# Patient Record
Sex: Female | Born: 1955 | Race: White | Hispanic: No | Marital: Married | Smoking: Never smoker
Health system: Southern US, Community
[De-identification: ages and names within clinical notes are randomized; demographics above are authoritative.]

## PROBLEM LIST (undated history)

## (undated) DIAGNOSIS — G4730 Sleep apnea, unspecified: Secondary | ICD-10-CM

## (undated) DIAGNOSIS — K219 Gastro-esophageal reflux disease without esophagitis: Secondary | ICD-10-CM

## (undated) DIAGNOSIS — E039 Hypothyroidism, unspecified: Secondary | ICD-10-CM

## (undated) DIAGNOSIS — E079 Disorder of thyroid, unspecified: Secondary | ICD-10-CM

## (undated) DIAGNOSIS — T7840XA Allergy, unspecified, initial encounter: Secondary | ICD-10-CM

## (undated) DIAGNOSIS — F32A Depression, unspecified: Secondary | ICD-10-CM

## (undated) DIAGNOSIS — R011 Cardiac murmur, unspecified: Secondary | ICD-10-CM

## (undated) DIAGNOSIS — F329 Major depressive disorder, single episode, unspecified: Secondary | ICD-10-CM

## (undated) HISTORY — DX: Depression, unspecified: F32.A

## (undated) HISTORY — DX: Disorder of thyroid, unspecified: E07.9

## (undated) HISTORY — PX: BREAST SURGERY: SHX581

## (undated) HISTORY — DX: Cardiac murmur, unspecified: R01.1

## (undated) HISTORY — DX: Allergy, unspecified, initial encounter: T78.40XA

## (undated) HISTORY — DX: Gastro-esophageal reflux disease without esophagitis: K21.9

## (undated) HISTORY — DX: Major depressive disorder, single episode, unspecified: F32.9

---

## 2001-10-29 ENCOUNTER — Encounter: Admission: RE | Admit: 2001-10-29 | Discharge: 2001-10-29 | Payer: Self-pay | Admitting: General Surgery

## 2001-10-29 ENCOUNTER — Encounter (HOSPITAL_BASED_OUTPATIENT_CLINIC_OR_DEPARTMENT_OTHER): Payer: Self-pay | Admitting: General Surgery

## 2001-10-31 ENCOUNTER — Encounter (HOSPITAL_BASED_OUTPATIENT_CLINIC_OR_DEPARTMENT_OTHER): Payer: Self-pay | Admitting: General Surgery

## 2001-10-31 ENCOUNTER — Encounter: Admission: RE | Admit: 2001-10-31 | Discharge: 2001-10-31 | Payer: Self-pay | Admitting: General Surgery

## 2001-10-31 ENCOUNTER — Ambulatory Visit (HOSPITAL_BASED_OUTPATIENT_CLINIC_OR_DEPARTMENT_OTHER): Admission: RE | Admit: 2001-10-31 | Discharge: 2001-10-31 | Payer: Self-pay | Admitting: General Surgery

## 2002-09-07 ENCOUNTER — Ambulatory Visit: Admission: RE | Admit: 2002-09-07 | Discharge: 2002-09-07 | Payer: Self-pay | Admitting: Pulmonary Disease

## 2002-11-24 ENCOUNTER — Ambulatory Visit (HOSPITAL_COMMUNITY): Admission: RE | Admit: 2002-11-24 | Discharge: 2002-11-24 | Payer: Self-pay | Admitting: Internal Medicine

## 2003-05-31 ENCOUNTER — Ambulatory Visit (HOSPITAL_COMMUNITY): Admission: RE | Admit: 2003-05-31 | Discharge: 2003-05-31 | Payer: Self-pay | Admitting: Internal Medicine

## 2012-10-30 ENCOUNTER — Other Ambulatory Visit (HOSPITAL_COMMUNITY): Payer: Self-pay | Admitting: *Deleted

## 2012-10-30 DIAGNOSIS — E079 Disorder of thyroid, unspecified: Secondary | ICD-10-CM

## 2012-11-06 ENCOUNTER — Ambulatory Visit (HOSPITAL_COMMUNITY)
Admission: RE | Admit: 2012-11-06 | Discharge: 2012-11-06 | Disposition: A | Discharge status: Home / Self Care | Payer: 59 | Source: Ambulatory Visit | Attending: *Deleted | Admitting: *Deleted

## 2012-11-06 ENCOUNTER — Other Ambulatory Visit (HOSPITAL_COMMUNITY): Payer: Self-pay | Admitting: *Deleted

## 2012-11-06 DIAGNOSIS — E079 Disorder of thyroid, unspecified: Secondary | ICD-10-CM

## 2012-11-06 DIAGNOSIS — E041 Nontoxic single thyroid nodule: Secondary | ICD-10-CM | POA: Insufficient documentation

## 2013-10-06 ENCOUNTER — Ambulatory Visit (INDEPENDENT_AMBULATORY_CARE_PROVIDER_SITE_OTHER): Payer: Medicare PPO | Admitting: Family Medicine

## 2013-10-06 ENCOUNTER — Encounter: Payer: Self-pay | Admitting: Family Medicine

## 2013-10-06 VITALS — BP 92/64 | HR 70 | Temp 97.6°F | Ht 63.5 in | Wt 171.2 lb

## 2013-10-06 DIAGNOSIS — K219 Gastro-esophageal reflux disease without esophagitis: Secondary | ICD-10-CM

## 2013-10-06 DIAGNOSIS — G8929 Other chronic pain: Secondary | ICD-10-CM

## 2013-10-06 DIAGNOSIS — M79609 Pain in unspecified limb: Secondary | ICD-10-CM

## 2013-10-06 DIAGNOSIS — E663 Overweight: Secondary | ICD-10-CM

## 2013-10-06 DIAGNOSIS — Z0000 Encounter for general adult medical examination without abnormal findings: Secondary | ICD-10-CM

## 2013-10-06 DIAGNOSIS — R609 Edema, unspecified: Secondary | ICD-10-CM

## 2013-10-06 DIAGNOSIS — K5900 Constipation, unspecified: Secondary | ICD-10-CM

## 2013-10-06 DIAGNOSIS — E042 Nontoxic multinodular goiter: Secondary | ICD-10-CM

## 2013-10-06 LAB — CBC WITH DIFFERENTIAL/PLATELET
Basophils Absolute: 0 10*3/uL (ref 0.0–0.1)
Basophils Relative: 1 % (ref 0–1)
Eosinophils Absolute: 0.1 10*3/uL (ref 0.0–0.7)
Eosinophils Relative: 2 % (ref 0–5)
HCT: 42.2 % (ref 36.0–46.0)
Hemoglobin: 14.3 g/dL (ref 12.0–15.0)
Lymphs Abs: 1.1 10*3/uL (ref 0.7–4.0)
MCH: 29.5 pg (ref 26.0–34.0)
MCHC: 33.9 g/dL (ref 30.0–36.0)
MCV: 87 fL (ref 78.0–100.0)
Monocytes Absolute: 0.3 10*3/uL (ref 0.1–1.0)
Monocytes Relative: 8 % (ref 3–12)
Neutro Abs: 2.2 10*3/uL (ref 1.7–7.7)
RBC: 4.85 MIL/uL (ref 3.87–5.11)
RDW: 14.2 % (ref 11.5–15.5)

## 2013-10-06 NOTE — Patient Instructions (Signed)
Sciatica with Rehab The sciatic nerve runs from the back down the leg and is responsible for sensation and control of the muscles in the back (posterior) side of the thigh, lower leg, and foot. Sciatica is a condition that is characterized by inflammation of this nerve.  SYMPTOMS   Signs of nerve damage, including numbness and/or weakness along the posterior side of the lower extremity.  Pain in the back of the thigh that may also travel down the leg.  Pain that worsens when sitting for long periods of time.  Occasionally, pain in the back or buttock. CAUSES  Inflammation of the sciatic nerve is the cause of sciatica. The inflammation is due to something irritating the nerve. Common sources of irritation include:  Sitting for long periods of time.  Direct trauma to the nerve.  Arthritis of the spine.  Herniated or ruptured disk.  Slipping of the vertebrae (spondylolithesis)  Pressure from soft tissues, such as muscles or ligament-like tissue (fascia). RISK INCREASES WITH:  Sports that place pressure or stress on the spine (football or weightlifting).  Poor strength and flexibility.  Failure to warm-up properly before activity.  Family history of low back pain or disk disorders.  Previous back injury or surgery.  Poor body mechanics, especially when lifting, or poor posture. PREVENTION   Warm up and stretch properly before activity.  Maintain physical fitness:  Strength, flexibility, and endurance.  Cardiovascular fitness.  Learn and use proper technique, especially with posture and lifting. When possible, have coach correct improper technique.  Avoid activities that place stress on the spine. PROGNOSIS If treated properly, then sciatica usually resolves within 6 weeks. However, occasionally surgery is necessary.  RELATED COMPLICATIONS   Permanent nerve damage, including pain, numbness, tingle, or weakness.  Chronic back pain.  Risks of surgery: infection,  bleeding, nerve damage, or damage to surrounding tissues. TREATMENT Treatment initially involves resting from any activities that aggravate your symptoms. The use of ice and medication may help reduce pain and inflammation. The use of strengthening and stretching exercises may help reduce pain with activity. These exercises may be performed at home or with referral to a therapist. A therapist may recommend further treatments, such as transcutaneous electronic nerve stimulation (TENS) or ultrasound. Your caregiver may recommend corticosteroid injections to help reduce inflammation of the sciatic nerve. If symptoms persist despite non-surgical (conservative) treatment, then surgery may be recommended. MEDICATION  If pain medication is necessary, then nonsteroidal anti-inflammatory medications, such as aspirin and ibuprofen, or other minor pain relievers, such as acetaminophen, are often recommended.  Do not take pain medication for 7 days before surgery.  Prescription pain relievers may be given if deemed necessary by your caregiver. Use only as directed and only as much as you need.  Ointments applied to the skin may be helpful.  Corticosteroid injections may be given by your caregiver. These injections should be reserved for the most serious cases, because they may only be given a certain number of times. HEAT AND COLD  Cold treatment (icing) relieves pain and reduces inflammation. Cold treatment should be applied for 10 to 15 minutes every 2 to 3 hours for inflammation and pain and immediately after any activity that aggravates your symptoms. Use ice packs or massage the area with a piece of ice (ice massage).  Heat treatment may be used prior to performing the stretching and strengthening activities prescribed by your caregiver, physical therapist, or athletic trainer. Use a heat pack or soak the injury in warm water. SEEK   MEDICAL CARE IF:  Treatment seems to offer no benefit, or the condition  worsens.  Any medications produce adverse side effects. EXERCISES  RANGE OF MOTION (ROM) AND STRETCHING EXERCISES - Sciatica Most people with sciatic will find that their symptoms worsen with either excessive bending forward (flexion) or arching at the low back (extension). The exercises which will help resolve your symptoms will focus on the opposite motion. Your physician, physical therapist or athletic trainer will help you determine which exercises will be most helpful to resolve your low back pain. Do not complete any exercises without first consulting with your clinician. Discontinue any exercises which worsen your symptoms until you speak to your clinician. If you have pain, numbness or tingling which travels down into your buttocks, leg or foot, the goal of the therapy is for these symptoms to move closer to your back and eventually resolve. Occasionally, these leg symptoms will get better, but your low back pain may worsen; this is typically an indication of progress in your rehabilitation. Be certain to be very alert to any changes in your symptoms and the activities in which you participated in the 24 hours prior to the change. Sharing this information with your clinician will allow him/her to most efficiently treat your condition. These exercises may help you when beginning to rehabilitate your injury. Your symptoms may resolve with or without further involvement from your physician, physical therapist or athletic trainer. While completing these exercises, remember:   Restoring tissue flexibility helps normal motion to return to the joints. This allows healthier, less painful movement and activity.  An effective stretch should be held for at least 30 seconds.  A stretch should never be painful. You should only feel a gentle lengthening or release in the stretched tissue. FLEXION RANGE OF MOTION AND STRETCHING EXERCISES: STRETCH  Flexion, Single Knee to Chest   Lie on a firm bed or floor  with both legs extended in front of you.  Keeping one leg in contact with the floor, bring your opposite knee to your chest. Hold your leg in place by either grabbing behind your thigh or at your knee.  Pull until you feel a gentle stretch in your low back. Hold __________ seconds.  Slowly release your grasp and repeat the exercise with the opposite side. Repeat __________ times. Complete this exercise __________ times per day.  STRETCH  Flexion, Double Knee to Chest  Lie on a firm bed or floor with both legs extended in front of you.  Keeping one leg in contact with the floor, bring your opposite knee to your chest.  Tense your stomach muscles to support your back and then lift your other knee to your chest. Hold your legs in place by either grabbing behind your thighs or at your knees.  Pull both knees toward your chest until you feel a gentle stretch in your low back. Hold __________ seconds.  Tense your stomach muscles and slowly return one leg at a time to the floor. Repeat __________ times. Complete this exercise __________ times per day.  STRETCH  Low Trunk Rotation   Lie on a firm bed or floor. Keeping your legs in front of you, bend your knees so they are both pointed toward the ceiling and your feet are flat on the floor.  Extend your arms out to the side. This will stabilize your upper body by keeping your shoulders in contact with the floor.  Gently and slowly drop both knees together to one side until you   feel a gentle stretch in your low back. Hold for __________ seconds.  Tense your stomach muscles to support your low back as you bring your knees back to the starting position. Repeat the exercise to the other side. Repeat __________ times. Complete this exercise __________ times per day  EXTENSION RANGE OF MOTION AND FLEXIBILITY EXERCISES: STRETCH  Extension, Prone on Elbows  Lie on your stomach on the floor, a bed will be too soft. Place your palms about shoulder  width apart and at the height of your head.  Place your elbows under your shoulders. If this is too painful, stack pillows under your chest.  Allow your body to relax so that your hips drop lower and make contact more completely with the floor.  Hold this position for __________ seconds.  Slowly return to lying flat on the floor. Repeat __________ times. Complete this exercise __________ times per day.  RANGE OF MOTION  Extension, Prone Press Ups  Lie on your stomach on the floor, a bed will be too soft. Place your palms about shoulder width apart and at the height of your head.  Keeping your back as relaxed as possible, slowly straighten your elbows while keeping your hips on the floor. You may adjust the placement of your hands to maximize your comfort. As you gain motion, your hands will come more underneath your shoulders.  Hold this position __________ seconds.  Slowly return to lying flat on the floor. Repeat __________ times. Complete this exercise __________ times per day.  STRENGTHENING EXERCISES - Sciatica  These exercises may help you when beginning to rehabilitate your injury. These exercises should be done near your "sweet spot." This is the neutral, low-back arch, somewhere between fully rounded and fully arched, that is your least painful position. When performed in this safe range of motion, these exercises can be used for people who have either a flexion or extension based injury. These exercises may resolve your symptoms with or without further involvement from your physician, physical therapist or athletic trainer. While completing these exercises, remember:   Muscles can gain both the endurance and the strength needed for everyday activities through controlled exercises.  Complete these exercises as instructed by your physician, physical therapist or athletic trainer. Progress with the resistance and repetition exercises only as your caregiver advises.  You may  experience muscle soreness or fatigue, but the pain or discomfort you are trying to eliminate should never worsen during these exercises. If this pain does worsen, stop and make certain you are following the directions exactly. If the pain is still present after adjustments, discontinue the exercise until you can discuss the trouble with your clinician. STRENGTHENING Deep Abdominals, Pelvic Tilt   Lie on a firm bed or floor. Keeping your legs in front of you, bend your knees so they are both pointed toward the ceiling and your feet are flat on the floor.  Tense your lower abdominal muscles to press your low back into the floor. This motion will rotate your pelvis so that your tail bone is scooping upwards rather than pointing at your feet or into the floor.  With a gentle tension and even breathing, hold this position for __________ seconds. Repeat __________ times. Complete this exercise __________ times per day.  STRENGTHENING  Abdominals, Crunches   Lie on a firm bed or floor. Keeping your legs in front of you, bend your knees so they are both pointed toward the ceiling and your feet are flat on the floor. Cross   your arms over your chest.  Slightly tip your chin down without bending your neck.  Tense your abdominals and slowly lift your trunk high enough to just clear your shoulder blades. Lifting higher can put excessive stress on the low back and does not further strengthen your abdominal muscles.  Control your return to the starting position. Repeat __________ times. Complete this exercise __________ times per day.  STRENGTHENING  Quadruped, Opposite UE/LE Lift  Assume a hands and knees position on a firm surface. Keep your hands under your shoulders and your knees under your hips. You may place padding under your knees for comfort.  Find your neutral spine and gently tense your abdominal muscles so that you can maintain this position. Your shoulders and hips should form a rectangle that  is parallel with the floor and is not twisted.  Keeping your trunk steady, lift your right hand no higher than your shoulder and then your left leg no higher than your hip. Make sure you are not holding your breath. Hold this position __________ seconds.  Continuing to keep your abdominal muscles tense and your back steady, slowly return to your starting position. Repeat with the opposite arm and leg. Repeat __________ times. Complete this exercise __________ times per day.  STRENGTHENING  Abdominals and Quadriceps, Straight Leg Raise   Lie on a firm bed or floor with both legs extended in front of you.  Keeping one leg in contact with the floor, bend the other knee so that your foot can rest flat on the floor.  Find your neutral spine, and tense your abdominal muscles to maintain your spinal position throughout the exercise.  Slowly lift your straight leg off the floor about 6 inches for a count of 15, making sure to not hold your breath.  Still keeping your neutral spine, slowly lower your leg all the way to the floor. Repeat this exercise with each leg __________ times. Complete this exercise __________ times per day. POSTURE AND BODY MECHANICS CONSIDERATIONS - Sciatica Keeping correct posture when sitting, standing or completing your activities will reduce the stress put on different body tissues, allowing injured tissues a chance to heal and limiting painful experiences. The following are general guidelines for improved posture. Your physician or physical therapist will provide you with any instructions specific to your needs. While reading these guidelines, remember:  The exercises prescribed by your provider will help you have the flexibility and strength to maintain correct postures.  The correct posture provides the optimal environment for your joints to work. All of your joints have less wear and tear when properly supported by a spine with good posture. This means you will  experience a healthier, less painful body.  Correct posture must be practiced with all of your activities, especially prolonged sitting and standing. Correct posture is as important when doing repetitive low-stress activities (typing) as it is when doing a single heavy-load activity (lifting). RESTING POSITIONS Consider which positions are most painful for you when choosing a resting position. If you have pain with flexion-based activities (sitting, bending, stooping, squatting), choose a position that allows you to rest in a less flexed posture. You would want to avoid curling into a fetal position on your side. If your pain worsens with extension-based activities (prolonged standing, working overhead), avoid resting in an extended position such as sleeping on your stomach. Most people will find more comfort when they rest with their spine in a more neutral position, neither too rounded nor too arched. Lying   on a non-sagging bed on your side with a pillow between your knees, or on your back with a pillow under your knees will often provide some relief. Keep in mind, being in any one position for a prolonged period of time, no matter how correct your posture, can still lead to stiffness. PROPER SITTING POSTURE In order to minimize stress and discomfort on your spine, you must sit with correct posture Sitting with good posture should be effortless for a healthy body. Returning to good posture is a gradual process. Many people can work toward this most comfortably by using various supports until they have the flexibility and strength to maintain this posture on their own. When sitting with proper posture, your ears will fall over your shoulders and your shoulders will fall over your hips. You should use the back of the chair to support your upper back. Your low back will be in a neutral position, just slightly arched. You may place a small pillow or folded towel at the base of your low back for support.  When  working at a desk, create an environment that supports good, upright posture. Without extra support, muscles fatigue and lead to excessive strain on joints and other tissues. Keep these recommendations in mind: CHAIR:   A chair should be able to slide under your desk when your back makes contact with the back of the chair. This allows you to work closely.  The chair's height should allow your eyes to be level with the upper part of your monitor and your hands to be slightly lower than your elbows. BODY POSITION  Your feet should make contact with the floor. If this is not possible, use a foot rest.  Keep your ears over your shoulders. This will reduce stress on your neck and low back. INCORRECT SITTING POSTURES   If you are feeling tired and unable to assume a healthy sitting posture, do not slouch or slump. This puts excessive strain on your back tissues, causing more damage and pain. Healthier options include:  Using more support, like a lumbar pillow.  Switching tasks to something that requires you to be upright or walking.  Talking a brief walk.  Lying down to rest in a neutral-spine position. PROLONGED STANDING WHILE SLIGHTLY LEANING FORWARD  When completing a task that requires you to lean forward while standing in one place for a long time, place either foot up on a stationary 2-4 inch high object to help maintain the best posture. When both feet are on the ground, the low back tends to lose its slight inward curve. If this curve flattens (or becomes too large), then the back and your other joints will experience too much stress, fatigue more quickly and can cause pain.  CORRECT STANDING POSTURES Proper standing posture should be assumed with all daily activities, even if they only take a few moments, like when brushing your teeth. As in sitting, your ears should fall over your shoulders and your shoulders should fall over your hips. You should keep a slight tension in your abdominal  muscles to brace your spine. Your tailbone should point down to the ground, not behind your body, resulting in an over-extended swayback posture.  INCORRECT STANDING POSTURES  Common incorrect standing postures include a forward head, locked knees and/or an excessive swayback. WALKING Walk with an upright posture. Your ears, shoulders and hips should all line-up. PROLONGED ACTIVITY IN A FLEXED POSITION When completing a task that requires you to bend forward at your   waist or lean over a low surface, try to find a way to stabilize 3 of 4 of your limbs. You can place a hand or elbow on your thigh or rest a knee on the surface you are reaching across. This will provide you more stability so that your muscles do not fatigue as quickly. By keeping your knees relaxed, or slightly bent, you will also reduce stress across your low back. CORRECT LIFTING TECHNIQUES DO :   Assume a wide stance. This will provide you more stability and the opportunity to get as close as possible to the object which you are lifting.  Tense your abdominals to brace your spine; then bend at the knees and hips. Keeping your back locked in a neutral-spine position, lift using your leg muscles. Lift with your legs, keeping your back straight.  Test the weight of unknown objects before attempting to lift them.  Try to keep your elbows locked down at your sides in order get the best strength from your shoulders when carrying an object.  Always ask for help when lifting heavy or awkward objects. INCORRECT LIFTING TECHNIQUES DO NOT:   Lock your knees when lifting, even if it is a small object.  Bend and twist. Pivot at your feet or move your feet when needing to change directions.  Assume that you cannot safely pick up a paperclip without proper posture. Document Released: 11/05/2005 Document Revised: 01/28/2012 Document Reviewed: 02/17/2009 [facility] Patient Information 2014 [place], [place]. Health Maintenance, Female A  healthy lifestyle and preventative care can promote health and wellness.  Maintain regular health, dental, and eye exams.  Eat a healthy diet. Foods like vegetables, fruits, whole grains, low-fat dairy products, and lean protein foods contain the nutrients you need without too many calories. Decrease your intake of foods high in solid fats, added sugars, and salt. Get information about a proper diet from your caregiver, if necessary.  Regular physical exercise is one of the most important things you can do for your health. Most adults should get at least 150 minutes of moderate-intensity exercise (any activity that increases your heart rate and causes you to sweat) each week. In addition, most adults need muscle-strengthening exercises on 2 or more days a week.   Maintain a healthy weight. The body mass index (BMI) is a screening tool to identify possible weight problems. It provides an estimate of body fat based on height and weight. Your caregiver can help determine your BMI, and can help you achieve or maintain a healthy weight. For adults 20 years and older:  A BMI below 18.5 is considered underweight.  A BMI of 18.5 to 24.9 is normal.  A BMI of 25 to 29.9 is considered overweight.  A BMI of 30 and above is considered obese.  Maintain normal blood lipids and cholesterol by exercising and minimizing your intake of saturated fat. Eat a balanced diet with plenty of fruits and vegetables. Blood tests for lipids and cholesterol should begin at age 26 and be repeated every 5 years. If your lipid or cholesterol levels are high, you are over 50, or you are a high risk for heart disease, you may need your cholesterol levels checked more frequently.Ongoing high lipid and cholesterol levels should be treated with medicines if diet and exercise are not effective.  If you smoke, find out from your caregiver how to quit. If you do not use tobacco, do not start.  Lung cancer screening is recommended for  adults aged 16 80 years who  are at high risk for developing lung cancer because of a history of smoking. Yearly low-dose computed tomography (CT) is recommended for people who have at least a 30-pack-year history of smoking and are a current smoker or have quit within the past 15 years. A pack year of smoking is smoking an average of 1 pack of cigarettes a day for 1 year (for example: 1 pack a day for 30 years or 2 packs a day for 15 years). Yearly screening should continue until the smoker has stopped smoking for at least 15 years. Yearly screening should also be stopped for people who develop a health problem that would prevent them from having lung cancer treatment.  If you are pregnant, do not drink alcohol. If you are breastfeeding, be very cautious about drinking alcohol. If you are not pregnant and choose to drink alcohol, do not exceed 1 drink per day. One drink is considered to be 12 ounces (355 mL) of beer, 5 ounces (148 mL) of wine, or 1.5 ounces (44 mL) of liquor.  Avoid use of street drugs. Do not share needles with anyone. Ask for help if you need support or instructions about stopping the use of drugs.  High blood pressure causes heart disease and increases the risk of stroke. Blood pressure should be checked at least every 1 to 2 years. Ongoing high blood pressure should be treated with medicines, if weight loss and exercise are not effective.  If you are 84 to 59 years old, ask your caregiver if you should take aspirin to prevent strokes.  Diabetes screening involves taking a blood sample to check your fasting blood sugar level. This should be done once every 3 years, after age 54, if you are within normal weight and without risk factors for diabetes. Testing should be considered at a younger age or be carried out more frequently if you are overweight and have at least 1 risk factor for diabetes.  Breast cancer screening is essential preventative care for women. You should practice  "breast self-awareness." This means understanding the normal appearance and feel of your breasts and may include breast self-examination. Any changes detected, no matter how small, should be reported to a caregiver. Women in their 15s and 30s should have a clinical breast exam (CBE) by a caregiver as part of a regular health exam every 1 to 3 years. After age 60, women should have a CBE every year. Starting at age 49, women should consider having a mammogram (breast X-ray) every year. Women who have a family history of breast cancer should talk to their caregiver about genetic screening. Women at a high risk of breast cancer should talk to their caregiver about having an MRI and a mammogram every year.  Breast cancer gene (BRCA)-related cancer risk assessment is recommended for women who have family members with BRCA-related cancers. BRCA-related cancers include breast, ovarian, tubal, and peritoneal cancers. Having family members with these cancers may be associated with an increased risk for harmful changes (mutations) in the breast cancer genes BRCA1 and BRCA2. Results of the assessment will determine the need for genetic counseling and BRCA1 and BRCA2 testing.  The Pap test is a screening test for cervical cancer. Women should have a Pap test starting at age 85. Between ages 45 and 93, Pap tests should be repeated every 2 years. Beginning at age 24, you should have a Pap test every 3 years as long as the past 3 Pap tests have been normal. If you had a  hysterectomy for a problem that was not cancer or a condition that could lead to cancer, then you no longer need Pap tests. If you are between ages 8 and 23, and you have had normal Pap tests going back 10 years, you no longer need Pap tests. If you have had past treatment for cervical cancer or a condition that could lead to cancer, you need Pap tests and screening for cancer for at least 20 years after your treatment. If Pap tests have been discontinued,  risk factors (such as a new sexual partner) need to be reassessed to determine if screening should be resumed. Some women have medical problems that increase the chance of getting cervical cancer. In these cases, your caregiver may recommend more frequent screening and Pap tests.  The human papillomavirus (HPV) test is an additional test that may be used for cervical cancer screening. The HPV test looks for the virus that can cause the cell changes on the cervix. The cells collected during the Pap test can be tested for HPV. The HPV test could be used to screen women aged 33 years and older, and should be used in women of any age who have unclear Pap test results. After the age of 28, women should have HPV testing at the same frequency as a Pap test.  Colorectal cancer can be detected and often prevented. Most routine colorectal cancer screening begins at the age of 29 and continues through age 18. However, your caregiver may recommend screening at an earlier age if you have risk factors for colon cancer. On a yearly basis, your caregiver may provide home test kits to check for hidden blood in the stool. Use of a small camera at the end of a tube, to directly examine the colon (sigmoidoscopy or colonoscopy), can detect the earliest forms of colorectal cancer. Talk to your caregiver about this at age 50, when routine screening begins. Direct examination of the colon should be repeated every 5 to 10 years through age 75, unless early forms of pre-cancerous polyps or small growths are found.  Hepatitis C blood testing is recommended for all people born from [birth year] through 1967 and any individual with known risks for hepatitis C.  Practice safe sex. Use condoms and avoid high-risk sexual practices to reduce the spread of sexually transmitted infections (STIs). Sexually active women aged 26 and younger should be checked for Chlamydia, which is a common sexually transmitted infection. Older women with new or  multiple partners should also be tested for Chlamydia. Testing for other STIs is recommended if you are sexually active and at increased risk.  Osteoporosis is a disease in which the bones lose minerals and strength with aging. This can result in serious bone fractures. The risk of osteoporosis can be identified using a bone density scan. Women ages 51 and over and women at risk for fractures or osteoporosis should discuss screening with their caregivers. Ask your caregiver whether you should be taking a calcium supplement or vitamin D to reduce the rate of osteoporosis.  Menopause can be associated with physical symptoms and risks. Hormone replacement therapy is available to decrease symptoms and risks. You should talk to your caregiver about whether hormone replacement therapy is right for you.  Use sunscreen. Apply sunscreen liberally and repeatedly throughout the day. You should seek shade when your shadow is shorter than you. Protect yourself by wearing long sleeves, pants, a wide-brimmed hat, and sunglasses year round, whenever you are outdoors.  Notify your caregiver of  new moles or changes in moles, especially if there is a change in shape or color. Also notify your caregiver if a mole is larger than the size of a pencil eraser.  Stay current with your immunizations. Document Released: 05/21/2011 Document Revised: 03/02/2013 Document Reviewed: 05/21/2011 [facility], [facility], [facility] Patient Information 2014 [place], [place].

## 2013-10-06 NOTE — Progress Notes (Signed)
Subjective:    Patient ID: Elaine Brady, female    DOB: [birth year], 57 y.o.   MRN: [id number]  HPI Thyroid nodules - Pt has h/o thyroid nodules most recently evluated 12/13. Repeat u/s was suggested for 6 mos but she ha snot had done yet.   Hypothyroid - h/o hypothyroid. sythroid increased 8 weeks ago.   Back pain - b/l low back, worse with standing and riding long distances in the car. Radiates down back of legs. Intermittant. No cord compression signs.   Foot pain - worse in heel on left foot, saw chiropractor (while she was there for back) and was told 2% plantar fasciitis and given exercises to do which have not really helped. It is worse with first steps of day.   Swelling of b/l LE - on bumex for h/o b/l LE swelling. Her diet is very low salt and  she eats protein 1-2 times daily. Has not had echo. No h/o shob. She does have a h/o palpitations and cardiac murmer in the past.   Constipation - resolved on mg and probiotics, has not had mag checked. Occasional miralax  gerd - on ppi, had egd years ago and was told she just had irritation. Occasional feeling that food gets stuck in throat but not bothering her. 20mg  ppi has not controlled sx.   Insomnia - stable on 100mg  trazodone qhs. Tried melatonin and it didn't help.   Also uses essential oils - copaiba frankinsense, balsom fir, in vegetable capsule  will try tumeric  # Health maint - pap - 1 year ago, normal, no abnormals, due in 3 years (unsire about hpv on pap) - mammo/pert FH - scheduled, had bx in past - physician breast exam - had at gyn this year - c-scope/pert FH - 3-4 year ago as f/u, had large polyp removed prior, due in 2 years - dexa - no risk factors, had dexa few years ago - will obtain results - BP screen - 92/64 at goal - cholesterol screen - 1 year ago checked, due for recheck - fasting glucose screen -  1 year ago, wnl but strong FH DM - chlamydia screen (if female under age 77) - does not request to be  checked - tobacco - none - alcohol - none - drugs - none - Hep C (born 78-1967) - doesn't think she has been checked, so due - tdap (once over age 63 in place of DT) - 2012 - DT (every 10 years until age 29, once age 77+) - due in 8 years - flu - plans to get at work - pneumovax - not old enough yet - zostavax (if over 33) - not old enough yet - Vit D - has not had checked  Review of Systems per hpi     Objective:   Physical Exam  Nursing note and vitals reviewed. Constitutional: She is oriented to person, place, and time. She appears well-developed and well-nourished.  HENT:  Right Ear: External ear normal.  Left Ear: External ear normal.  Nose: Nose normal.  Mouth/Throat: Oropharynx is clear and moist. No oropharyngeal exudate.  Eyes: Conjunctivae are normal. Pupils are equal, round, and reactive to light.  Neck: Normal range of motion. Neck supple. No thyromegaly present although slight fullness felt on left.  Cardiovascular: Normal rate, regular rhythm and normal heart sounds.   Pulmonary/Chest: Effort normal and breath sounds normal.  Abdominal: Soft. Bowel sounds are normal. She exhibits no distension. There is no tenderness. There is no rebound.  Lymphadenopathy:    She has no cervical adenopathy.  Neurological: She is alert and oriented to person, place, and time. She has normal reflexes.  Skin: Skin is warm and dry. numerous cherry hemangiomas. Pt declined full skin survey.she does have atoenail fungus Psychiatric: She has a normal mood and affect. Her behavior is normal.        Assessment & Plan:  Deyanira was seen today for establish care. We did her annual cpe as well as address >3 chronic medical conditions.   Diagnoses and associated orders for this visit:  Multiple thyroid nodules - US Soft Tissue Head/Neck; Future - get within next 2 weeks  Heel pain, chronic, left - DG Foot Complete Left; Future - r/o heel spurs  Edema - 2D Echocardiogram without  contrast - Comprehensive metabolic panel   Health care maintenance - CBC with Differential - Comprehensive metabolic panel - Hemoglobin A1c - T4, free - TSH - Vitamin B12 - Vit D  25 hydroxy (rtn osteoporosis monitoring) - Lipid panel - Magnesium - Hepatitis C antibody  GERD (gastroesophageal reflux disease) - CBC with Differential - if worse will repeat egd  Overweight - Hemoglobin A1c - TSH - Lipid panel  Unspecified constipation - Magnesium  Insomnia - cont ttrazodone and wean off if able  Toenail fungyus - vicks for 3-4 mos  I'll see her back in 6 mos to f/u on chronic med problems, 1 year for cpe

## 2013-10-07 ENCOUNTER — Telehealth: Payer: Self-pay | Admitting: *Deleted

## 2013-10-07 LAB — COMPREHENSIVE METABOLIC PANEL
AST: 60 U/L — ABNORMAL HIGH (ref 0–37)
Albumin: 4.2 g/dL (ref 3.5–5.2)
BUN: 19 mg/dL (ref 6–23)
Calcium: 9.3 mg/dL (ref 8.4–10.5)
Chloride: 101 mEq/L (ref 96–112)
Glucose, Bld: 93 mg/dL (ref 70–99)
Potassium: 4.3 mEq/L (ref 3.5–5.3)
Sodium: 137 mEq/L (ref 135–145)
Total Bilirubin: 0.5 mg/dL (ref 0.3–1.2)
Total Protein: 6.9 g/dL (ref 6.0–8.3)

## 2013-10-07 LAB — LIPID PANEL
Cholesterol: 246 mg/dL — ABNORMAL HIGH (ref 0–200)
HDL: 61 mg/dL (ref >39)
Total CHOL/HDL Ratio: 4 Ratio
Triglycerides: 157 mg/dL — ABNORMAL HIGH (ref <150)
VLDL: 31 mg/dL (ref 0–40)

## 2013-10-07 LAB — HEMOGLOBIN A1C: Mean Plasma Glucose: 114 mg/dL (ref <117)

## 2013-10-07 LAB — HEPATITIS C ANTIBODY: HCV Ab: NEGATIVE

## 2013-10-07 LAB — VITAMIN B12: Vitamin B-12: 679 pg/mL (ref 211–911)

## 2013-10-07 LAB — T4, FREE: Free T4: 1.23 ng/dL (ref 0.80–1.80)

## 2013-10-07 NOTE — Telephone Encounter (Signed)
Pt called nurse back and she was made aware of lab results and message left from MD. Pt appreciative and understanding.

## 2013-10-07 NOTE — Telephone Encounter (Signed)
Nurse called number on file for pt, no answer, message left for callback.  

## 2013-10-07 NOTE — Telephone Encounter (Signed)
Message copied by [facility], Bonnell Public on Wed Oct 07, 2013  2:18 PM ------      Message from: Acey Lav      Created: Wed Oct 07, 2013  8:18 AM       Please let pt know I got her labs back.       Cbc, a1c, tsh, T4, B12, magnesium, hep C - look good      cmp - fine except her ast and alt (liver markers) are mildly elevated. Has she ever been told these numbers were up in the past?      Vit D - low. Im sending her in high dose vit D for 8 weeks to take once weekly. After that she should take an OTC calcium plus D tablet of her choosing. When i see her in f/u we can recheck her D level.       Cholesterol - high. Id like her to maximize her lifestyle modifications for the next few months and we can recheck cholesterol in 6 mos or so. She should eat oatmeal daily if possible, not eat much red meat, and be sure to exercise at least 20 minutes 5x/week.      I'll let her know when i get results of her upcoming studies and will plan to see her in 3 mos as scheduled. Thanks AW ------

## 2013-10-07 NOTE — Telephone Encounter (Signed)
Message copied by [facility], Bonnell Public on Wed Oct 07, 2013  2:24 PM ------      Message from: Acey Lav      Created: Wed Oct 07, 2013  8:18 AM       Please let pt know I got her labs back.       Cbc, a1c, tsh, T4, B12, magnesium, hep C - look good      cmp - fine except her ast and alt (liver markers) are mildly elevated. Has she ever been told these numbers were up in the past?      Vit D - low. Im sending her in high dose vit D for 8 weeks to take once weekly. After that she should take an OTC calcium plus D tablet of her choosing. When i see her in f/u we can recheck her D level.       Cholesterol - high. Id like her to maximize her lifestyle modifications for the next few months and we can recheck cholesterol in 6 mos or so. She should eat oatmeal daily if possible, not eat much red meat, and be sure to exercise at least 20 minutes 5x/week.      I'll let her know when i get results of her upcoming studies and will plan to see her in 3 mos as scheduled. Thanks AW ------

## 2013-10-21 ENCOUNTER — Telehealth: Payer: Self-pay | Admitting: *Deleted

## 2013-10-21 NOTE — Telephone Encounter (Signed)
Pt called and left VM stating that she was supposed to have a scan done and had questions regarding a Rx that she did not receive. Nurse returned call, no answer, message left for callback.

## 2013-10-22 ENCOUNTER — Telehealth: Payer: Self-pay | Admitting: *Deleted

## 2013-10-22 DIAGNOSIS — E039 Hypothyroidism, unspecified: Secondary | ICD-10-CM

## 2013-10-22 DIAGNOSIS — E559 Vitamin D deficiency, unspecified: Secondary | ICD-10-CM

## 2013-10-22 NOTE — Telephone Encounter (Signed)
Pt called and left VM again stating that MD was supposed to call in an order for Vitamin D and she needed a refill on her Synthroid that she has not received and she was following up. Will route to MD.

## 2013-10-23 MED ORDER — CHOLECALCIFEROL 1.25 MG (50000 UT) PO TABS: ORAL_TABLET | ORAL | Status: DC

## 2013-10-23 MED ORDER — LEVOTHYROXINE SODIUM 75 MCG PO TABS: 75.0000 ug | ORAL_TABLET | Freq: Every day | ORAL | Status: DC

## 2013-10-23 NOTE — Telephone Encounter (Signed)
Pt notified and appreciative.

## 2013-10-23 NOTE — Telephone Encounter (Signed)
Ive sent these in. Thanks AW.

## 2013-10-23 NOTE — Addendum Note (Signed)
Addended by: Acey Lav on: 10/23/2013 08:18 AM   Modules accepted: Orders

## 2013-10-27 ENCOUNTER — Telehealth: Payer: Self-pay | Admitting: Family Medicine

## 2013-10-27 NOTE — Telephone Encounter (Signed)
No refill indicated. AW

## 2013-10-27 NOTE — Telephone Encounter (Signed)
Refill request: Vitamin D# 500000 UNT CAP     QTY:8     Take one capsule by mouth once a week for 8 weeks  [employer] [phone]

## 2013-11-06 ENCOUNTER — Telehealth: Payer: Self-pay | Admitting: *Deleted

## 2013-11-06 ENCOUNTER — Telehealth: Payer: Self-pay | Admitting: Family Medicine

## 2013-11-06 NOTE — Telephone Encounter (Signed)
I received a mammogram report today on this patient. Please let the patient know that her mammogram was within normal limits. She should be receiving a copy of the report to her house. She should have a repeat screening mammogram in one year. Thank you

## 2013-11-06 NOTE — Telephone Encounter (Signed)
See telephone encounter.

## 2013-11-06 NOTE — Telephone Encounter (Signed)
Pt notified and appreciative.

## 2013-11-25 ENCOUNTER — Telehealth: Payer: Self-pay | Admitting: *Deleted

## 2013-11-25 DIAGNOSIS — F329 Major depressive disorder, single episode, unspecified: Secondary | ICD-10-CM

## 2013-11-25 DIAGNOSIS — F32A Depression, unspecified: Secondary | ICD-10-CM

## 2013-11-25 NOTE — Telephone Encounter (Signed)
Pt called and left VM stating that she was running low on medication and needed a refill before her appointment in February. She requests refill on Prozac. Will route to MD for approval

## 2013-11-26 MED ORDER — FLUOXETINE HCL 20 MG PO CAPS: 20.0000 mg | ORAL_CAPSULE | Freq: Every day | ORAL | Status: DC

## 2013-11-26 NOTE — Telephone Encounter (Signed)
She is only currently needing refill on prozac. The medication is effective and is working for her. She stated she has been on medication for years and that she did make staff aware of medication when she came in for initial visit.

## 2013-11-26 NOTE — Addendum Note (Signed)
Addended by: Doran Heater on: 11/26/2013 12:19 PM   Modules accepted: Orders

## 2013-11-26 NOTE — Telephone Encounter (Signed)
What meds, aside from prozac does she request refills on? I did not see her for depression or anything related to prozac so Id need to know if she has been on a stable dose, what the dose is, and how long she has been on it. Thanks aw

## 2013-11-30 ENCOUNTER — Ambulatory Visit (HOSPITAL_COMMUNITY)
Admission: RE | Admit: 2013-11-30 | Discharge: 2013-11-30 | Disposition: A | Discharge status: Home / Self Care | Payer: 59 | Source: Ambulatory Visit | Attending: Family Medicine | Admitting: Family Medicine

## 2013-11-30 DIAGNOSIS — Z6830 Body mass index (BMI) 30.0-30.9, adult: Secondary | ICD-10-CM | POA: Insufficient documentation

## 2013-11-30 DIAGNOSIS — I369 Nonrheumatic tricuspid valve disorder, unspecified: Secondary | ICD-10-CM

## 2013-11-30 DIAGNOSIS — R609 Edema, unspecified: Secondary | ICD-10-CM | POA: Insufficient documentation

## 2013-11-30 DIAGNOSIS — K219 Gastro-esophageal reflux disease without esophagitis: Secondary | ICD-10-CM | POA: Insufficient documentation

## 2013-11-30 DIAGNOSIS — R002 Palpitations: Secondary | ICD-10-CM | POA: Insufficient documentation

## 2013-11-30 NOTE — Progress Notes (Signed)
*  PRELIMINARY RESULTS* Echocardiogram 2D Echocardiogram has been performed.  [place], [place] 11/30/2013, 12:29 PM

## 2013-12-02 ENCOUNTER — Telehealth: Payer: Self-pay | Admitting: *Deleted

## 2013-12-02 NOTE — Telephone Encounter (Signed)
Pt called and notified of results. Appreciative and understanding.

## 2013-12-02 NOTE — Telephone Encounter (Signed)
Message copied by [facility], Louis Meckel on Wed Dec 02, 2013  3:06 PM ------      Message from: Doran Heater      Created: Wed Dec 02, 2013 10:02 AM       Please let pt know her echo looked great. Very mild leakiness on tricuspid valve which is not a big deal and doesn't mean anything as far as management goes. This would not give her any sx or cause problems for her. Thanks AW ------

## 2013-12-02 NOTE — Telephone Encounter (Signed)
Nurse called to give pt results. No answer, message left for callback

## 2013-12-02 NOTE — Progress Notes (Signed)
Pt notified and appreciative.

## 2013-12-02 NOTE — Telephone Encounter (Signed)
Message copied by [facility], Louis Meckel on Wed Dec 02, 2013  2:07 PM ------      Message from: Doran Heater      Created: Wed Dec 02, 2013 10:02 AM       Please let pt know her echo looked great. Very mild leakiness on tricuspid valve which is not a big deal and doesn't mean anything as far as management goes. This would not give her any sx or cause problems for her. Thanks AW ------

## 2013-12-07 ENCOUNTER — Ambulatory Visit (HOSPITAL_COMMUNITY)
Admission: RE | Admit: 2013-12-07 | Discharge: 2013-12-07 | Disposition: A | Discharge status: Home / Self Care | Payer: 59 | Source: Ambulatory Visit | Attending: Family Medicine | Admitting: Family Medicine

## 2013-12-07 DIAGNOSIS — M79672 Pain in left foot: Principal | ICD-10-CM

## 2013-12-07 DIAGNOSIS — M898X9 Other specified disorders of bone, unspecified site: Secondary | ICD-10-CM | POA: Insufficient documentation

## 2013-12-07 DIAGNOSIS — M79609 Pain in unspecified limb: Secondary | ICD-10-CM | POA: Insufficient documentation

## 2013-12-07 DIAGNOSIS — G8929 Other chronic pain: Secondary | ICD-10-CM

## 2013-12-07 DIAGNOSIS — E042 Nontoxic multinodular goiter: Secondary | ICD-10-CM | POA: Insufficient documentation

## 2013-12-17 ENCOUNTER — Telehealth: Payer: Self-pay | Admitting: *Deleted

## 2013-12-17 NOTE — Telephone Encounter (Signed)
Pt called requesting results from recent tests done. Will route to MD.

## 2013-12-18 ENCOUNTER — Telehealth: Payer: Self-pay | Admitting: *Deleted

## 2013-12-18 NOTE — Telephone Encounter (Signed)
Message copied by [facility], Louis Meckel on Fri Dec 18, 2013  4:21 PM ------      Message from: Doran Heater      Created: Fri Dec 18, 2013 11:41 AM       Please let pt know she has a small heel spur on the bottom of her foot. If she is still having pain we can refer to podiatry. Thanks aw ------

## 2013-12-18 NOTE — Telephone Encounter (Signed)
Pt notified and appreciative.

## 2013-12-18 NOTE — Telephone Encounter (Signed)
Message copied by [facility], Louis Meckel on Fri Dec 18, 2013  4:23 PM ------      Message from: Doran Heater      Created: Fri Dec 18, 2013 11:40 AM       Please let pt know u/s shows thyroid stable. Thanks aw ------

## 2013-12-21 ENCOUNTER — Telehealth: Payer: Self-pay | Admitting: Family Medicine

## 2013-12-21 NOTE — Telephone Encounter (Signed)
Podiatry ref'l

## 2014-01-04 ENCOUNTER — Encounter: Payer: Self-pay | Admitting: Family Medicine

## 2014-01-04 ENCOUNTER — Ambulatory Visit (INDEPENDENT_AMBULATORY_CARE_PROVIDER_SITE_OTHER): Payer: BC Managed Care – PPO | Admitting: Family Medicine

## 2014-01-04 VITALS — BP 126/78 | HR 61 | Temp 98.6°F | Resp 18 | Ht 62.0 in | Wt 169.2 lb

## 2014-01-04 DIAGNOSIS — E039 Hypothyroidism, unspecified: Secondary | ICD-10-CM | POA: Insufficient documentation

## 2014-01-04 DIAGNOSIS — E785 Hyperlipidemia, unspecified: Secondary | ICD-10-CM

## 2014-01-04 DIAGNOSIS — R609 Edema, unspecified: Secondary | ICD-10-CM

## 2014-01-04 DIAGNOSIS — K219 Gastro-esophageal reflux disease without esophagitis: Secondary | ICD-10-CM

## 2014-01-04 DIAGNOSIS — G4700 Insomnia, unspecified: Secondary | ICD-10-CM | POA: Insufficient documentation

## 2014-01-04 DIAGNOSIS — J309 Allergic rhinitis, unspecified: Secondary | ICD-10-CM

## 2014-01-04 LAB — LIPID PANEL
CHOL/HDL RATIO: 3.7 ratio
Cholesterol: 236 mg/dL — ABNORMAL HIGH (ref 0–200)
HDL: 64 mg/dL (ref >39)
LDL Cholesterol: 151 mg/dL — ABNORMAL HIGH (ref 0–99)
Triglycerides: 105 mg/dL (ref <150)
VLDL: 21 mg/dL (ref 0–40)

## 2014-01-04 LAB — BASIC METABOLIC PANEL
BUN: 18 mg/dL (ref 6–23)
CALCIUM: 9.3 mg/dL (ref 8.4–10.5)
CHLORIDE: 106 meq/L (ref 96–112)
CO2: 25 mEq/L (ref 19–32)
Creat: 0.72 mg/dL (ref 0.50–1.10)
GLUCOSE: 88 mg/dL (ref 70–99)
Potassium: 5 mEq/L (ref 3.5–5.3)
Sodium: 137 mEq/L (ref 135–145)

## 2014-01-04 MED ORDER — OMEPRAZOLE 40 MG PO CPDR
40.0000 mg | DELAYED_RELEASE_CAPSULE | Freq: Every day | ORAL | Status: AC
Start: 2014-01-04 — End: ?

## 2014-01-04 MED ORDER — BUMETANIDE 0.5 MG PO TABS: 0.2500 mg | ORAL_TABLET | Freq: Every day | ORAL | Status: DC | PRN

## 2014-01-04 MED ORDER — TRAZODONE HCL 100 MG PO TABS: 50.0000 mg | ORAL_TABLET | Freq: Every evening | ORAL | Status: DC | PRN

## 2014-01-04 MED ORDER — LEVOCETIRIZINE DIHYDROCHLORIDE 5 MG PO TABS: 5.0000 mg | ORAL_TABLET | Freq: Every evening | ORAL | Status: DC

## 2014-01-04 NOTE — Patient Instructions (Signed)
Bumetanide tablets What is this medicine? BUMETANIDE (byoo MET a nide) is a diuretic. It helps you make more urine and to lose salt and excess water from your body. This medicine is used to treat high blood pressure, and edema or swelling from heart, kidney, or liver disease. This medicine may be used for other purposes; ask your health care provider or pharmacist if you have questions. COMMON BRAND NAME(S): Bumex What should I tell my health care provider before I take this medicine? They need to know if you have any of these conditions: -abnormal blood electrolytes -diarrhea or vomiting -gout -heart disease -kidney disease, small amounts of urine, or difficulty passing urine -liver disease -an unusual or allergic reaction to bumetanide, sulfa drugs, other medicines, foods, dyes, or preservatives -pregnant or trying to get pregnant -breast-feeding How should I use this medicine? Take this medicine by mouth with a glass of water. Follow the directions on the prescription label. You may take this medicine with or without food but if it upsets your stomach, take it with food or milk. Do not take it more often than directed. Remember that you will need to pass more urine after taking this medicine. Do not take it at a time of day that will cause you problems. Do not take at bedtime. Talk to your pediatrician regarding the use of this medicine in children. Special care may be needed. Overdosage: If you think you have taken too much of this medicine contact a poison control center or emergency room at once. NOTE: This medicine is only for you. Do not share this medicine with others. What if I miss a dose? If you miss a dose, take it as soon as you can. If it is almost time for your next dose, take only that dose. Do not take double or extra doses. What may interact with this medicine? -alcohol -certain antibiotics given by injection -diuretics -heart medicines like digoxin and  dofetilide -hormones like cortisone, fludrocortisone, hydrocortisone -lithium -medicines for diabetes -medicines for high blood pressure -medicines for inflammation like indomethacin -OTC supplements like ginseng and ephedra This list may not describe all possible interactions. Give your health care provider a list of all the medicines, herbs, non-prescription drugs, or dietary supplements you use. Also tell them if you smoke, drink alcohol, or use illegal drugs. Some items may interact with your medicine. What should I watch for while using this medicine? Visit your doctor or health care professional for regular check ups. Check your blood pressure regularly. Ask your doctor or health care professional what your blood pressure should be, and when you should contact him or her. If you are a diabetic, check your blood sugar as directed. You may need to be on a special diet while taking this medicine. Ask your doctor. Also, ask how many glasses of fluid you need to drink each day. You must not get dehydrated. You may get drowsy or dizzy. Do not drive, use machinery, or do anything that needs mental alertness until you know how this drug affects you. Do not stand or sit up quickly, especially if you are an older patient. This reduces the risk of dizzy or fainting spells. Alcohol can make you more drowsy and dizzy. Avoid alcoholic drinks. What side effects may I notice from receiving this medicine? Side effects that you should report to your doctor or health care professional as soon as possible: -allergic reactions like skin rash, itching or hives, swelling of the face, lips, or tongue -blood in  the urine or stools -blurred vision -dry mouth -fever or chills -hearing loss or ringing in the ears -irregular heartbeat -muscle cramps, pain or weakness -unusually weak or tired -vomiting or diarrhea Side effects that usually do not require medical attention (report to your doctor or health care  professional if they continue or are bothersome): -headache -loss of appetite -unusual bleeding or bruising This list may not describe all possible side effects. Call your doctor for medical advice about side effects. You may report side effects to FDA at 1-800-FDA-1088. Where should I keep my medicine? Keep out of the reach of children. Store at room temperature between 15 and 30 degrees C (59 and 86 degrees F). Throw away any unused medicine after the expiration date. NOTE: This sheet is a summary. It may not cover all possible information. If you have questions about this medicine, talk to your doctor, pharmacist, or health care provider.  2014, [employer]. (2008-02-04 15:55:56)

## 2014-01-04 NOTE — Progress Notes (Signed)
Subjective:     Patient ID: Elaine Brady, female   DOB: [birth year], 58 y.o.   MRN: [id number]  HPI Comments: Elaine Brady is a 58 y.o WF here for 3 month follow up.  She is here for medication refills of her chronic medical conditions to include peripheral edema, GERD, Insomnia, Allergic rhinitis, and hypothyroidism. She has been on the same medicines for years. She is also taking Bumex for fluid and she says she takes this everyday, despite having normal Echo a few months ago. She says she has never had any heart trouble or SOB. She does report some occasional dizziness that comes and goes for months now but she has attributed this to her allergies. She says she hasn't noticed the dizziness in the last few days and says she hasn't really paid attention to it when asked about this symptom in the last week. She does report being out of her Bumex for the last week because she ran out and knew she had an appt soon and she would get this medicine filled today. She denies any SOB, chest pains, and says she feels fine other wise.  She also was noted to have HLD and was working on lifestyle changes in order to get this cholesterol lowered.  She is UTD with her mammogram and colonoscopy  Past Medical History  Diagnosis Date  . GERD (gastroesophageal reflux disease)   . Depression   . Heart murmur   . Thyroid disease   . Allergy    Current Outpatient Prescriptions on File Prior to Visit  Medication Sig Dispense Refill  . Cholecalciferol 50000 UNITS TABS 1 tab weekly for 8 weeks  8 tablet  0  . FLUoxetine (PROZAC) 20 MG capsule Take 1 capsule (20 mg total) by mouth daily.  30 capsule  1  . levothyroxine (SYNTHROID, LEVOTHROID) 75 MCG tablet Take 1 tablet (75 mcg total) by mouth daily before breakfast.  90 tablet  1  . Magnesium 400 MG CAPS Take by mouth.      . Probiotic Product (PROBIOTIC DAILY PO) Take by mouth.       No current facility-administered medications on file prior to visit.   Allergies   Allergen Reactions  . Bactrim [Sulfamethoxazole-Tmp Ds] Rash   History   Social History  . Marital Status: Married    Spouse Name: N/A    Number of Children: N/A  . Years of Education: N/A   Occupational History  . Not on file.   Social History Main Topics  . Smoking status: Never Smoker   . Smokeless tobacco: Never Used  . Alcohol Use: No  . Drug Use: No  . Sexual Activity: Yes    Birth Control/ Protection: Post-menopausal   Other Topics Concern  . Not on file   Social History Narrative  . No narrative on file     Review of Systems  Constitutional: Negative for chills, activity change and appetite change.  HENT: Positive for sinus pressure. Negative for congestion, postnasal drip, rhinorrhea and sore throat.        Chronic and relieved by her xyzal   Eyes: Negative for discharge, redness and visual disturbance.  Cardiovascular: Positive for leg swelling. Negative for chest pain and palpitations.  Gastrointestinal: Negative for nausea, vomiting, abdominal pain, diarrhea and constipation.  Endocrine: Negative for cold intolerance, heat intolerance, polydipsia and polyuria.  Genitourinary: Positive for frequency. Negative for dysuria, urgency, hematuria and difficulty urinating.       In the morning time after taking  the Bumex   Musculoskeletal: Negative for back pain, gait problem and joint swelling.  Skin: Negative for color change and rash.  Allergic/Immunologic: Negative for environmental allergies and immunocompromised state.  Neurological: Positive for dizziness and light-headedness. Negative for syncope, speech difficulty, weakness, numbness and headaches.  Hematological: Negative for adenopathy. Does not bruise/bleed easily.  Psychiatric/Behavioral: Positive for sleep disturbance. Negative for suicidal ideas, confusion, decreased concentration and agitation.       Objective:   Physical Exam  Nursing note and vitals reviewed. Constitutional: She is  oriented to person, place, and time. She appears well-developed and well-nourished.  Wears corrective lenses   HENT:  Head: Normocephalic and atraumatic.  Right Ear: External ear normal.  Left Ear: External ear normal.  Nose: Nose normal.  Mouth/Throat: Oropharynx is clear and moist.  Eyes: Conjunctivae are normal. Pupils are equal, round, and reactive to light.  Neck: Normal range of motion. Neck supple. No JVD present. No tracheal deviation present. No thyromegaly present.  Cardiovascular: Normal rate, regular rhythm, normal heart sounds and intact distal pulses.   Pulmonary/Chest: Effort normal and breath sounds normal. No respiratory distress. She has no wheezes. She exhibits no tenderness.  Abdominal: Soft. Bowel sounds are normal. She exhibits no distension. There is no tenderness. There is no rebound.  Musculoskeletal: Normal range of motion.  Lymphadenopathy:    She has no cervical adenopathy.  Neurological: She is alert and oriented to person, place, and time.  Skin: Skin is warm and dry.  Psychiatric: She has a normal mood and affect. Her behavior is normal. Judgment and thought content normal.       Assessment:     [name] was seen today for follow-up.  Diagnoses and associated orders for this visit:  Edema - bumetanide (BUMEX) 0.5 MG tablet; Take 0.5 tablets (0.25 mg total) by mouth daily as needed. - Basic metabolic panel; Future - Basic metabolic panel  Insomnia - traZODone (DESYREL) 100 MG tablet; Take 0.5 tablets (50 mg total) by mouth at bedtime as needed for sleep.  GERD (gastroesophageal reflux disease) - omeprazole (PRILOSEC) 40 MG capsule; Take 1 capsule (40 mg total) by mouth daily.  Allergic rhinitis - levocetirizine (XYZAL) 5 MG tablet; Take 1 tablet (5 mg total) by mouth every evening.  HLD (hyperlipidemia) - Lipid Panel; Future - Lipid Panel  Unspecified hypothyroidism - TSH; Future - T4, free; Future - TSH - T4, free       Plan:      Refilled medicines and will go ahead and get blood work for monitoring of chronic medicines. To follow up via phone on labs and will see back in 6 months if lab results are good.

## 2014-01-05 LAB — T4, FREE: Free T4: 1.31 ng/dL (ref 0.80–1.80)

## 2014-01-05 LAB — TSH: TSH: 0.639 u[IU]/mL (ref 0.350–4.500)

## 2014-01-06 ENCOUNTER — Other Ambulatory Visit: Payer: Self-pay | Admitting: Family Medicine

## 2014-01-06 DIAGNOSIS — E039 Hypothyroidism, unspecified: Secondary | ICD-10-CM

## 2014-01-06 MED ORDER — LEVOTHYROXINE SODIUM 50 MCG PO TABS: 50.0000 ug | ORAL_TABLET | Freq: Every day | ORAL | Status: AC

## 2014-01-06 NOTE — Progress Notes (Signed)
Spoke to patient regarding the lab results. TSH down a great deal since 3 months ago. Have decreased her synthroid dose from 17mcg to 34mcg daily. Orders placed to get these rechecked in 8 weeks.

## 2014-01-20 ENCOUNTER — Other Ambulatory Visit: Payer: Self-pay | Admitting: Family Medicine

## 2014-11-29 ENCOUNTER — Encounter (INDEPENDENT_AMBULATORY_CARE_PROVIDER_SITE_OTHER): Payer: Self-pay | Admitting: *Deleted

## 2015-06-13 ENCOUNTER — Encounter (INDEPENDENT_AMBULATORY_CARE_PROVIDER_SITE_OTHER): Payer: Self-pay | Admitting: *Deleted

## 2015-06-24 ENCOUNTER — Other Ambulatory Visit (INDEPENDENT_AMBULATORY_CARE_PROVIDER_SITE_OTHER): Payer: Self-pay | Admitting: *Deleted

## 2015-06-24 ENCOUNTER — Encounter (INDEPENDENT_AMBULATORY_CARE_PROVIDER_SITE_OTHER): Payer: Self-pay | Admitting: *Deleted

## 2015-06-24 DIAGNOSIS — Z86010 Personal history of colonic polyps: Secondary | ICD-10-CM

## 2015-08-31 ENCOUNTER — Telehealth (INDEPENDENT_AMBULATORY_CARE_PROVIDER_SITE_OTHER): Payer: Self-pay | Admitting: *Deleted

## 2015-08-31 DIAGNOSIS — Z1211 Encounter for screening for malignant neoplasm of colon: Secondary | ICD-10-CM

## 2015-08-31 NOTE — Telephone Encounter (Signed)
Patient needs trilyte 

## 2015-09-01 MED ORDER — PEG 3350-KCL-NA BICARB-NACL 420 G PO SOLR: 4000.0000 mL | Freq: Once | ORAL | Status: DC

## 2015-09-13 ENCOUNTER — Telehealth (INDEPENDENT_AMBULATORY_CARE_PROVIDER_SITE_OTHER): Payer: Self-pay | Admitting: *Deleted

## 2015-09-13 NOTE — Telephone Encounter (Signed)
agree

## 2015-09-13 NOTE — Telephone Encounter (Signed)
Referring MD/PCP: fusco   Procedure: tcs  Reason/Indication:  Hx polyps  Has patient had this procedure before?  Yes, 6 yrs ago ([facility]) was normal -- Dr Laural Golden done one in early 2000 and a large polyp was removed, she repeats in 6 months and it was normal  If so, when, by whom and where?    Is there a family history of colon cancer?  no  Who?  What age when diagnosed?    Is patient diabetic?   no      Does patient have prosthetic heart valve or mechanical valve?  no  Do you have a pacemaker?  no  Has patient ever had endocarditis? no  Has patient had joint replacement within last 12 months?  no  Does patient tend to be constipated or take laxatives? sometime  Does patient have a history of alcohol/drug use?  no  Is patient on Coumadin, Plavix and/or Aspirin? no  Medications: fluoxetine 20 mg daily, bumetanide 0.5 mg 1/2 tab daily, levothyroxine 75 mcg daily, magnesium 400 mg daily, calcium daily, probiotic daily, multi vit daily, prilosec bid  Allergies: bactrim  Medication Adjustment:   Procedure date & time: 10/12/15 at 930

## 2015-10-12 ENCOUNTER — Encounter (HOSPITAL_COMMUNITY): Payer: Self-pay | Admitting: *Deleted

## 2015-10-12 ENCOUNTER — Encounter (HOSPITAL_COMMUNITY)
Admission: RE | Disposition: A | Discharge status: Home / Self Care | Payer: Self-pay | Source: Ambulatory Visit | Attending: Internal Medicine

## 2015-10-12 ENCOUNTER — Ambulatory Visit (HOSPITAL_COMMUNITY)
Admission: RE | Admit: 2015-10-12 | Discharge: 2015-10-12 | Disposition: A | Discharge status: Home / Self Care | Payer: BC Managed Care – PPO | Source: Ambulatory Visit | Attending: Internal Medicine | Admitting: Internal Medicine

## 2015-10-12 DIAGNOSIS — E079 Disorder of thyroid, unspecified: Secondary | ICD-10-CM | POA: Insufficient documentation

## 2015-10-12 DIAGNOSIS — K635 Polyp of colon: Secondary | ICD-10-CM | POA: Diagnosis not present

## 2015-10-12 DIAGNOSIS — Z79899 Other long term (current) drug therapy: Secondary | ICD-10-CM | POA: Diagnosis not present

## 2015-10-12 DIAGNOSIS — K648 Other hemorrhoids: Secondary | ICD-10-CM | POA: Diagnosis not present

## 2015-10-12 DIAGNOSIS — F329 Major depressive disorder, single episode, unspecified: Secondary | ICD-10-CM | POA: Diagnosis not present

## 2015-10-12 DIAGNOSIS — Z86010 Personal history of colonic polyps: Secondary | ICD-10-CM | POA: Diagnosis not present

## 2015-10-12 DIAGNOSIS — D125 Benign neoplasm of sigmoid colon: Secondary | ICD-10-CM | POA: Diagnosis not present

## 2015-10-12 DIAGNOSIS — K219 Gastro-esophageal reflux disease without esophagitis: Secondary | ICD-10-CM | POA: Diagnosis not present

## 2015-10-12 DIAGNOSIS — K644 Residual hemorrhoidal skin tags: Secondary | ICD-10-CM | POA: Diagnosis not present

## 2015-10-12 DIAGNOSIS — Z1211 Encounter for screening for malignant neoplasm of colon: Secondary | ICD-10-CM | POA: Insufficient documentation

## 2015-10-12 HISTORY — PX: COLONOSCOPY: SHX5424

## 2015-10-12 LAB — HEPATIC FUNCTION PANEL
ALK PHOS: 91 U/L (ref 38–126)
ALT: 43 U/L (ref 14–54)
AST: 30 U/L (ref 15–41)
Albumin: 4 g/dL (ref 3.5–5.0)
Bilirubin, Direct: 0.1 mg/dL (ref 0.1–0.5)
Indirect Bilirubin: 0.4 mg/dL (ref 0.3–0.9)
TOTAL PROTEIN: 6.9 g/dL (ref 6.5–8.1)
Total Bilirubin: 0.5 mg/dL (ref 0.3–1.2)

## 2015-10-12 SURGERY — COLONOSCOPY
Anesthesia: Moderate Sedation

## 2015-10-12 MED ORDER — MEPERIDINE HCL 50 MG/ML IJ SOLN
INTRAMUSCULAR | Status: AC
  Filled 2015-10-12: qty 1

## 2015-10-12 MED ORDER — MIDAZOLAM HCL 5 MG/5ML IJ SOLN
INTRAMUSCULAR | Status: AC
  Filled 2015-10-12: qty 10

## 2015-10-12 MED ORDER — SODIUM CHLORIDE 0.9 % IV SOLN
INTRAVENOUS | Status: DC
  Administered 2015-10-12: 1000 mL via INTRAVENOUS

## 2015-10-12 MED ORDER — MIDAZOLAM HCL 5 MG/5ML IJ SOLN
INTRAMUSCULAR | Status: DC | PRN
  Administered 2015-10-12: 2 mg via INTRAVENOUS
  Administered 2015-10-12: 1 mg via INTRAVENOUS
  Administered 2015-10-12 (×2): 2 mg via INTRAVENOUS

## 2015-10-12 MED ORDER — MEPERIDINE HCL 50 MG/ML IJ SOLN
INTRAMUSCULAR | Status: DC | PRN
  Administered 2015-10-12 (×2): 25 mg via INTRAVENOUS

## 2015-10-12 MED ORDER — STERILE WATER FOR IRRIGATION IR SOLN
Status: DC | PRN
  Administered 2015-10-12: 10:00:00

## 2015-10-12 NOTE — H&P (Signed)
Elaine Brady is an 59 y.o. female.   Chief Complaint:  Patient is here for colonoscopy. HPI:  Patient is 59 year old Caucasian female who has history of colonic adenomas and is here for surveillance colonoscopy. She had large tubular adenoma removal first colonoscopy. No polyps were found in second or third colonoscopy. Last exam was  In [facility]. She denies abdominal pain change in bowel habits or rectal bleeding.  Family history is negative for CRC.  Past Medical History  Diagnosis Date  . GERD (gastroesophageal reflux disease)   . Depression   . Heart murmur   . Thyroid disease   . Allergy     Past Surgical History  Procedure Laterality Date  . Breast surgery      Family History  Problem Relation Age of Onset  . Diabetes Mother   . Hyperlipidemia Mother   . Hypertension Mother   . Heart disease Father   . Diabetes Father   . Hyperlipidemia Sister   . Heart disease Sister   . Hyperlipidemia Brother   . Hypertension Brother   . Diabetes Brother   . Hyperlipidemia Brother   . Hyperlipidemia Brother    Social History:  reports that she has never smoked. She has never used smokeless tobacco. She reports that she does not drink alcohol or use illicit drugs.  Allergies:  Allergies  Allergen Reactions  . Bactrim [Sulfamethoxazole-Trimethoprim] Rash    Medications Prior to Admission  Medication Sig Dispense Refill  . calcium-vitamin D (OSCAL WITH D) 500-200 MG-UNIT per tablet Take 1 tablet by mouth.    Marland Kitchen FLUoxetine (PROZAC) 20 MG capsule TAKE ONE CAPSULE BY MOUTH ONCE DAILY 30 capsule 0  . levothyroxine (SYNTHROID, LEVOTHROID) 50 MCG tablet Take 1 tablet (50 mcg total) by mouth daily before breakfast. 60 tablet 0  . Magnesium 400 MG CAPS Take 1 capsule by mouth daily.     . Multiple Vitamins-Minerals (CENTRUM SILVER ADULT 50+ PO) Take 1 tablet by mouth daily.     Marland Kitchen omeprazole (PRILOSEC) 40 MG capsule Take 1 capsule (40 mg total) by mouth daily. 90  capsule 0  . polyethylene glycol-electrolytes (NULYTELY/GOLYTELY) 420 G solution Take 4,000 mLs by mouth once. 4000 mL 0  . Probiotic Product (PROBIOTIC DAILY PO) Take 1 capsule by mouth daily.     . bumetanide (BUMEX) 0.5 MG tablet Take 0.5 tablets (0.25 mg total) by mouth daily as needed. (Patient taking differently: Take 0.25 mg by mouth daily as needed (fluid). ) 90 tablet 0    No results found for this or any previous visit (from the past 48 hour(s)). No results found.  ROS  Blood pressure 127/75, pulse 64, temperature 98.9 F (37.2 C), temperature source Oral, resp. rate 11, height 5' 3.5" (1.613 m), weight 177 lb (80.287 kg), SpO2 99 %. Physical Exam  Constitutional: She appears well-developed and well-nourished.  HENT:  Mouth/Throat: Oropharynx is clear and moist.  Eyes: Conjunctivae are normal. No scleral icterus.  Neck: No thyromegaly present.  Cardiovascular: Normal rate, regular rhythm and normal heart sounds.   No murmur heard. Respiratory: Effort normal and breath sounds normal.  GI: Soft. She exhibits no distension and no mass. There is no tenderness.  Musculoskeletal: She exhibits no edema.  Lymphadenopathy:    She has no cervical adenopathy.  Neurological: She is alert.  Skin: Skin is warm and dry.     Assessment/Plan  History of colonic adenomas.  Surveillance colonoscopy  Elaine Brady U 10/12/2015, 9:32 AM

## 2015-10-12 NOTE — Discharge Instructions (Signed)
Resume usual medications and diet.  No driving for 24 hours.  Physician will call with results of blood tests and biopsy.  Next colonoscopy in 5 to 6 years.  Colonoscopy, Care After Refer to this sheet in the next few weeks. These instructions provide you with information on caring for yourself after your procedure. Your health care provider may also give you more specific instructions. Your treatment has been planned according to current medical practices, but problems sometimes occur. Call your health care provider if you have any problems or questions after your procedure. WHAT TO EXPECT AFTER THE PROCEDURE  After your procedure, it is typical to have the following:  A small amount of blood in your stool.  Moderate amounts of gas and mild abdominal cramping or bloating. HOME CARE INSTRUCTIONS  Do not drive, operate machinery, or sign important documents for 24 hours.  You may shower and resume your regular physical activities, but move at a slower pace for the first 24 hours.  Take frequent rest periods for the first 24 hours.  Walk around or put a warm pack on your abdomen to help reduce abdominal cramping and bloating.  Drink enough fluids to keep your urine clear or pale yellow.  You may resume your normal diet as instructed by your health care provider. Avoid heavy or fried foods that are hard to digest.  Avoid drinking alcohol for 24 hours or as instructed by your health care provider.  Only take over-the-counter or prescription medicines as directed by your health care provider.  If a tissue sample (biopsy) was taken during your procedure:  Do not take aspirin or blood thinners for 7 days, or as instructed by your health care provider.  Do not drink alcohol for 7 days, or as instructed by your health care provider.  Eat soft foods for the first 24 hours. SEEK MEDICAL CARE IF: You have persistent spotting of blood in your stool 2-3 days after the procedure. SEEK  IMMEDIATE MEDICAL CARE IF:  You have more than a small spotting of blood in your stool.  You pass large blood clots in your stool.  Your abdomen is swollen (distended).  You have nausea or vomiting.  You have a fever.  You have increasing abdominal pain that is not relieved with medicine.   This information is not intended to replace advice given to you by your health care provider. Make sure you discuss any questions you have with your health care provider.   Document Released: 06/19/2004 Document Revised: 08/26/2013 Document Reviewed: 07/13/2013 Elsevier Interactive Patient Education 2016 Elsevier Inc[employer]  Colon Polyps Polyps are lumps of extra tissue growing inside the body. Polyps can grow in the large intestine (colon). Most colon polyps are noncancerous (benign). However, some colon polyps can become cancerous over time. Polyps that are larger than a pea may be harmful. To be safe, caregivers remove and test all polyps. CAUSES  Polyps form when mutations in the genes cause your cells to grow and divide even though no more tissue is needed. RISK FACTORS There are a number of risk factors that can increase your chances of getting colon polyps. They include:  Being older than 50 years.  Family history of colon polyps or colon cancer.  Long-term colon diseases, such as colitis or Crohn disease.  Being overweight.  Smoking.  Being inactive.  Drinking too much alcohol. SYMPTOMS  Most small polyps do not cause symptoms. If symptoms are present, they may include:  Blood in the stool. The  stool may look dark red or black.  Constipation or diarrhea that lasts longer than 1 week. DIAGNOSIS People often do not know they have polyps until their caregiver finds them during a regular checkup. Your caregiver can use 4 tests to check for polyps:  Digital rectal exam. The caregiver wears gloves and feels inside the rectum. This test would find polyps only in the rectum.  Barium  enema. The caregiver puts a liquid called barium into your rectum before taking X-rays of your colon. Barium makes your colon look white. Polyps are dark, so they are easy to see in the X-ray pictures.  Sigmoidoscopy. A thin, flexible tube (sigmoidoscope) is placed into your rectum. The sigmoidoscope has a light and tiny camera in it. The caregiver uses the sigmoidoscope to look at the last third of your colon.  Colonoscopy. This test is like sigmoidoscopy, but the caregiver looks at the entire colon. This is the most common method for finding and removing polyps. TREATMENT  Any polyps will be removed during a sigmoidoscopy or colonoscopy. The polyps are then tested for cancer. PREVENTION  To help lower your risk of getting more colon polyps:  Eat plenty of fruits and vegetables. Avoid eating fatty foods.  Do not smoke.  Avoid drinking alcohol.  Exercise every day.  Lose weight if recommended by your caregiver.  Eat plenty of calcium and folate. Foods that are rich in calcium include milk, cheese, and broccoli. Foods that are rich in folate include chickpeas, kidney beans, and spinach. HOME CARE INSTRUCTIONS Keep all follow-up appointments as directed by your caregiver. You may need periodic exams to check for polyps. SEEK MEDICAL CARE IF: You notice bleeding during a bowel movement.   This information is not intended to replace advice given to you by your health care provider. Make sure you discuss any questions you have with your health care provider.   Document Released: 08/01/2004 Document Revised: 11/26/2014 Document Reviewed: 01/15/2012 Elsevier Interactive Patient Education [employer].

## 2015-10-12 NOTE — Op Note (Signed)
COLONOSCOPY PROCEDURE REPORT  PATIENT:  Elaine Brady  MR#:  [id number] Birthdate:  06-Oct-1956, 59 y.o., female Endoscopist:  Dr. Rogene Houston, MD Referred By:  Dr.  Glo Herring, MD Procedure Date: 10/12/2015  Procedure:   Colonoscopy  Indications:   Patient is 59 year old Caucasian female was history of colonic adenomas. Last exam was 6 years ago in [facility] no polyps are found.  Informed Consent:  The procedure and risks were reviewed with the patient and informed consent was obtained.  Medications:  Demerol 50 mg IV Versed 7 mg IV  Description of procedure:  After a digital rectal exam was performed, that colonoscope was advanced from the anus through the rectum and colon to the area of the cecum, ileocecal valve and appendiceal orifice. The cecum was deeply intubated. These structures were well-seen and photographed for the record. From the level of the cecum and ileocecal valve, the scope was slowly and cautiously withdrawn. The mucosal surfaces were carefully surveyed utilizing scope tip to flexion to facilitate fold flattening as needed. The scope was pulled down into the rectum where a thorough exam including retroflexion was performed.  Findings:   Prep excellent. Small polyp ablated via cold biopsy from proximal sigmoid colon. Normal rectal mucosa. Small hemorrhoids below the dentate line.  Therapeutic/Diagnostic Maneuvers Performed:   None  Complications:   none  EBL: None  Cecal Withdrawal Time:  13 minutes  Impression:   Examination performed to cecum.  Small polyp ablated while cold biopsy from sigmoid colon.  Small external hemorrhoids.   comment:  Patient noted to have elevated AST and ALT in July 2014.  Recommendations:  Standard instructions given.  Will check LFTs today. I will contact patient with biopsy results and further recommendations.   Vineeth Fell U  10/12/2015 10:10 AM  CC: Dr. Glo Herring., MD & Dr. Rayne Du  ref. provider found

## 2015-10-18 ENCOUNTER — Encounter (HOSPITAL_COMMUNITY): Payer: Self-pay | Admitting: Internal Medicine

## 2015-10-31 ENCOUNTER — Encounter (INDEPENDENT_AMBULATORY_CARE_PROVIDER_SITE_OTHER): Payer: Self-pay

## 2018-06-12 ENCOUNTER — Other Ambulatory Visit (HOSPITAL_COMMUNITY): Payer: Self-pay | Admitting: Family Medicine

## 2018-06-12 DIAGNOSIS — R748 Abnormal levels of other serum enzymes: Secondary | ICD-10-CM

## 2018-06-13 ENCOUNTER — Other Ambulatory Visit (HOSPITAL_COMMUNITY): Payer: Self-pay | Admitting: Family Medicine

## 2018-06-13 DIAGNOSIS — R748 Abnormal levels of other serum enzymes: Secondary | ICD-10-CM

## 2018-06-13 DIAGNOSIS — N950 Postmenopausal bleeding: Secondary | ICD-10-CM

## 2018-06-18 ENCOUNTER — Ambulatory Visit (HOSPITAL_COMMUNITY)
Admission: RE | Admit: 2018-06-18 | Discharge: 2018-06-18 | Disposition: A | Discharge status: Home / Self Care | Payer: BC Managed Care – PPO | Source: Ambulatory Visit | Attending: Family Medicine | Admitting: Family Medicine

## 2018-06-18 ENCOUNTER — Encounter (HOSPITAL_COMMUNITY): Payer: Self-pay

## 2018-06-18 DIAGNOSIS — N950 Postmenopausal bleeding: Secondary | ICD-10-CM

## 2018-06-18 DIAGNOSIS — R748 Abnormal levels of other serum enzymes: Secondary | ICD-10-CM

## 2018-07-07 ENCOUNTER — Encounter: Payer: Self-pay | Admitting: Obstetrics & Gynecology

## 2018-07-07 ENCOUNTER — Other Ambulatory Visit: Payer: Self-pay | Admitting: Obstetrics & Gynecology

## 2018-07-07 ENCOUNTER — Other Ambulatory Visit: Payer: Self-pay

## 2018-07-07 ENCOUNTER — Ambulatory Visit: Payer: BC Managed Care – PPO | Admitting: Obstetrics & Gynecology

## 2018-07-07 VITALS — BP 126/74 | HR 75 | Ht 63.5 in | Wt 190.0 lb

## 2018-07-07 DIAGNOSIS — N950 Postmenopausal bleeding: Secondary | ICD-10-CM | POA: Diagnosis not present

## 2018-07-07 DIAGNOSIS — N841 Polyp of cervix uteri: Secondary | ICD-10-CM

## 2018-07-07 NOTE — Progress Notes (Signed)
Endometrial Biopsy Procedure Note  Pre-operative Diagnosis: PMB on unopposed estrogen                                                Endometrial polyp  Post-operative Diagnosis: normal  Indications: postmenopausal bleeding  Procedure Details   Urine pregnancy test was not done.  The risks (including infection, bleeding, pain, and uterine perforation) and benefits of the procedure were explained to the patient and Written informed consent was obtained.  Antibiotic prophylaxis against endocarditis was not indicated.   The patient was placed in the dorsal lithotomy position.  Bimanual exam showed the uterus to be in the neutral position.  A Graves' speculum inserted in the vagina, and the cervix prepped with povidone iodine.  Endocervical curettage with a Kevorkian curette was not performed.   A sharp tenaculum was applied to the anterior lip of the cervix for stabilization.  A sterile uterine sound was used to sound the uterus to a depth of 6.5cm.  A Pipelle endometrial aspirator was used to sample the endometrium.  Sample was sent for pathologic examination.  Also an endocervical polyp was removed  Condition: Stable  Complications: None  Plan:  The patient was advised to call for any fever or for prolonged or severe pain or bleeding. She was advised to use OTC analgesics as needed for mild to moderate pain. She was advised to avoid vaginal intercourse for 48 hours or until the bleeding has completely stopped.  Attending Physician Documentation: I was present for or performed the following: endometrial biopsy and removal of endocervical polyp

## 2018-07-22 ENCOUNTER — Telehealth: Payer: Self-pay | Admitting: Obstetrics & Gynecology

## 2018-07-22 ENCOUNTER — Telehealth: Payer: Self-pay | Admitting: *Deleted

## 2018-07-22 MED ORDER — PROGESTERONE MICRONIZED 200 MG PO CAPS: 200.0000 mg | ORAL_CAPSULE | Freq: Every day | ORAL | 11 refills | Status: DC

## 2018-07-22 NOTE — Telephone Encounter (Signed)
LMOVM that Dr Elonda Husky sent in Rx for prometrium 200 mg daily, given her biopsy report and being on unopposed estrogen for 9 months . Advised to call if she had any further questions.

## 2018-07-22 NOTE — Telephone Encounter (Signed)
Meds ordered this encounter  Medications  . progesterone (PROMETRIUM) 200 MG capsule    Sig: Take 1 capsule (200 mg total) by mouth daily.    Dispense:  30 capsule    Refill:  11

## 2018-11-10 IMAGING — US US ABDOMEN COMPLETE
1 series · 14 of 25 positions shown · non-contrast
Comparison: None.

CLINICAL DATA: Elevated LFT

EXAM:
ABDOMEN ULTRASOUND COMPLETE

[Series 1: us abdomen complete · 0.19mm/px · 14 of 92 slices shown]
[im 1/92]
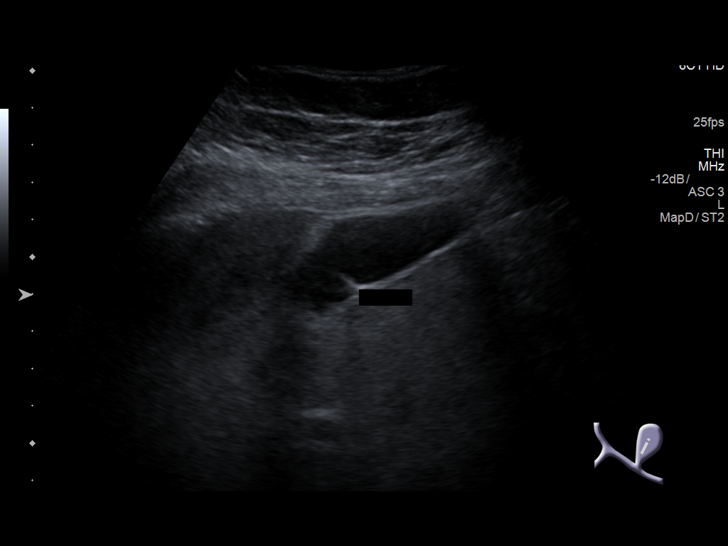
[im 8/92]
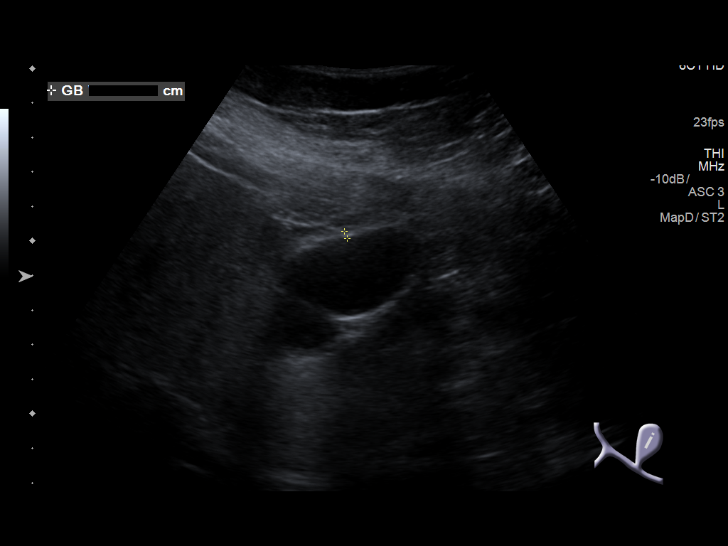
[im 16/92]
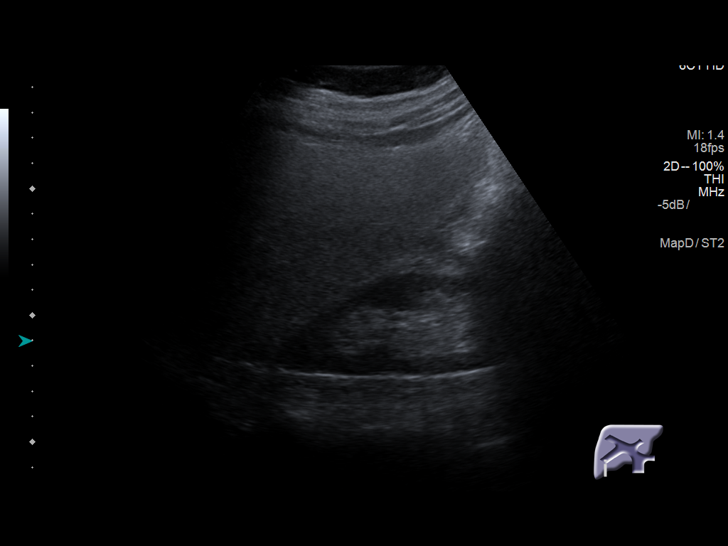
[im 23/92]
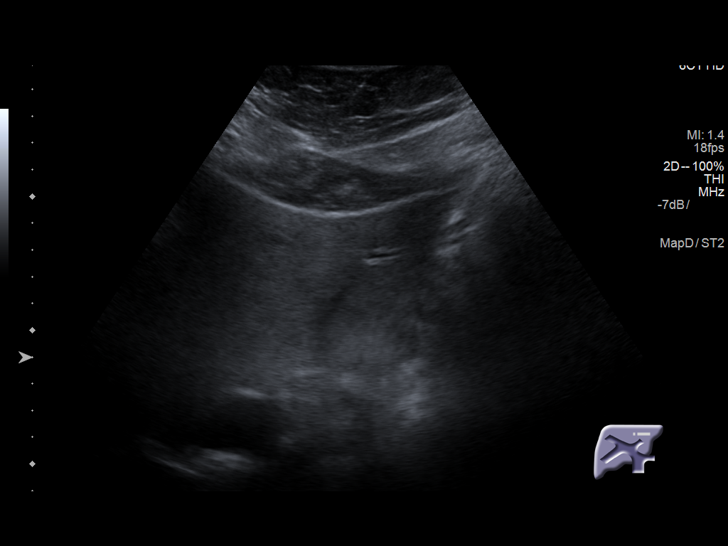
[im 31/92]
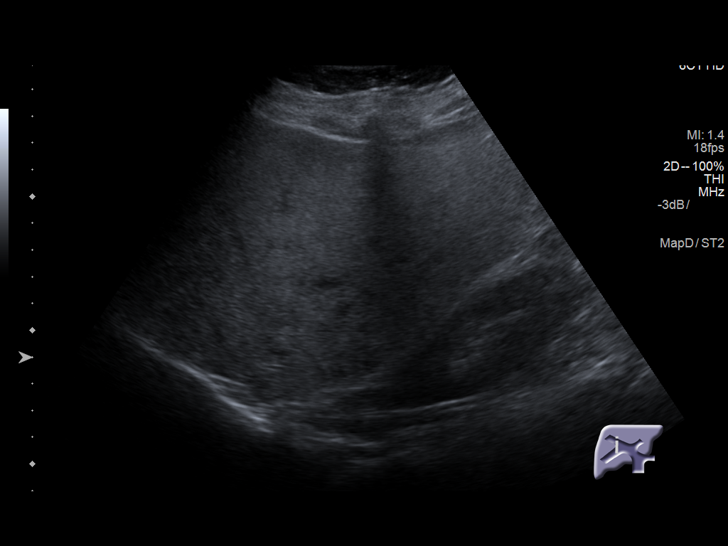
[im 35/92]
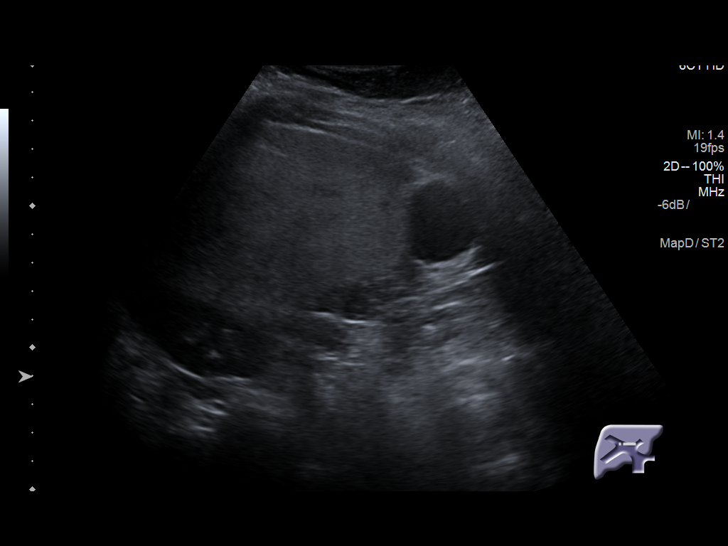
[im 42/92]
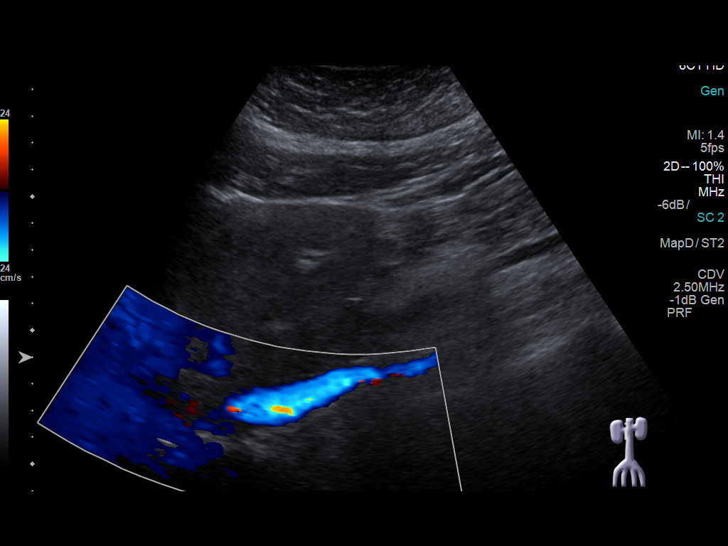
[im 50/92]
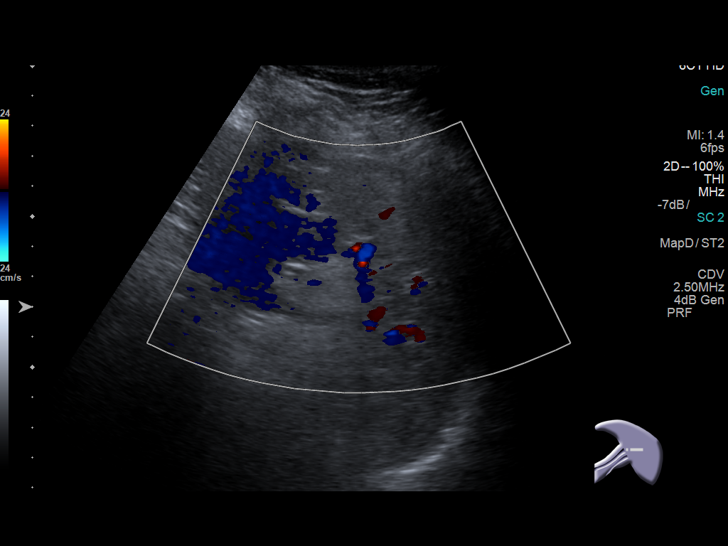
[im 57/92]
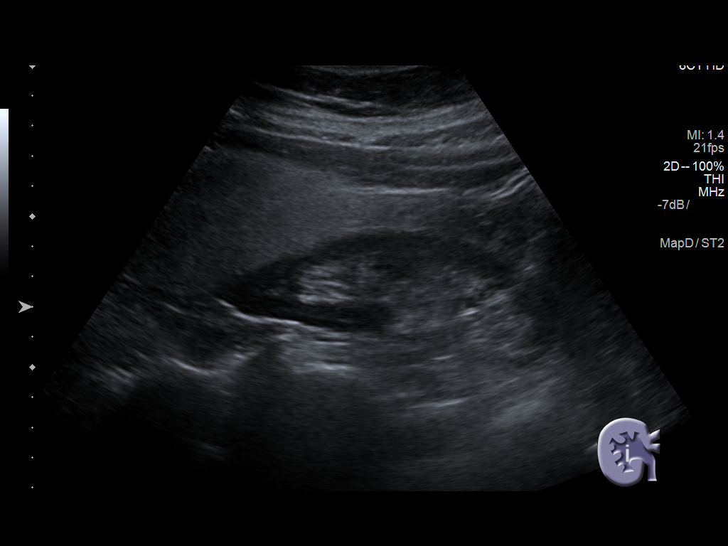
[im 61/92]
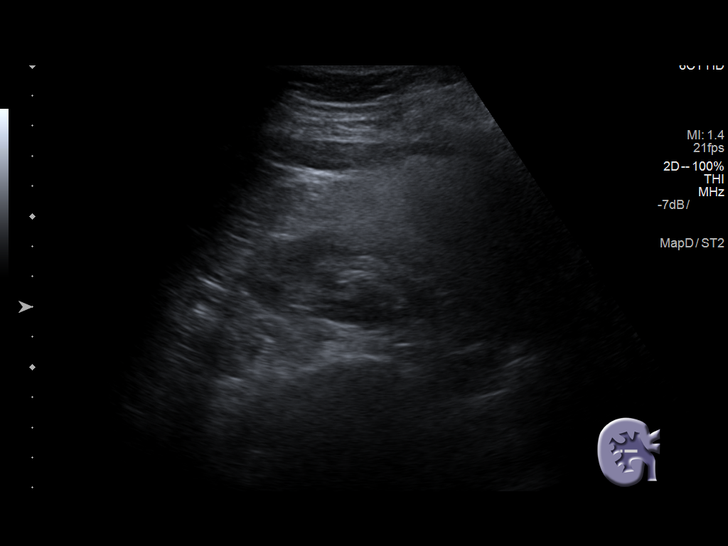
[im 69/92]
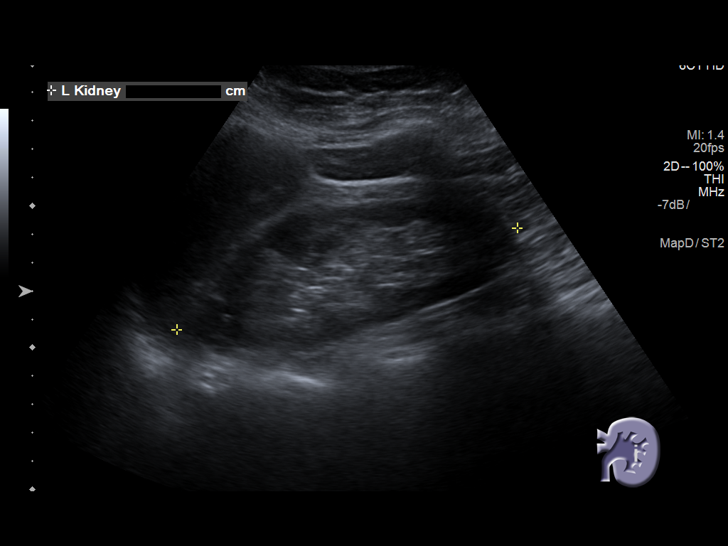
[im 76/92]
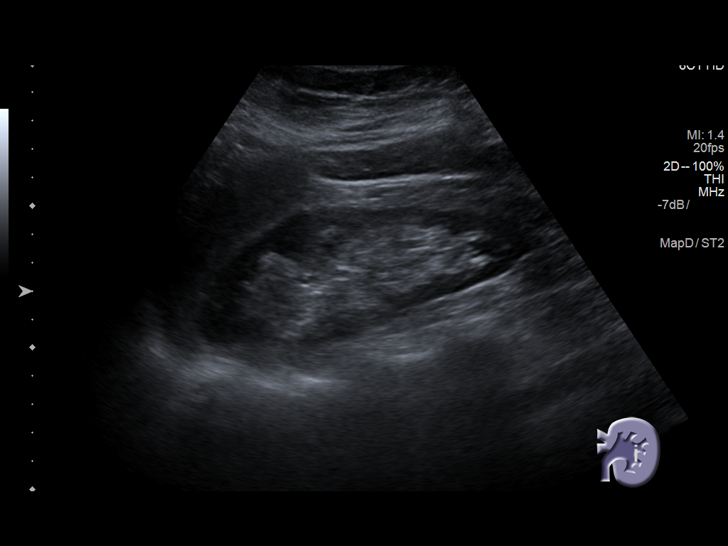
[im 84/92]
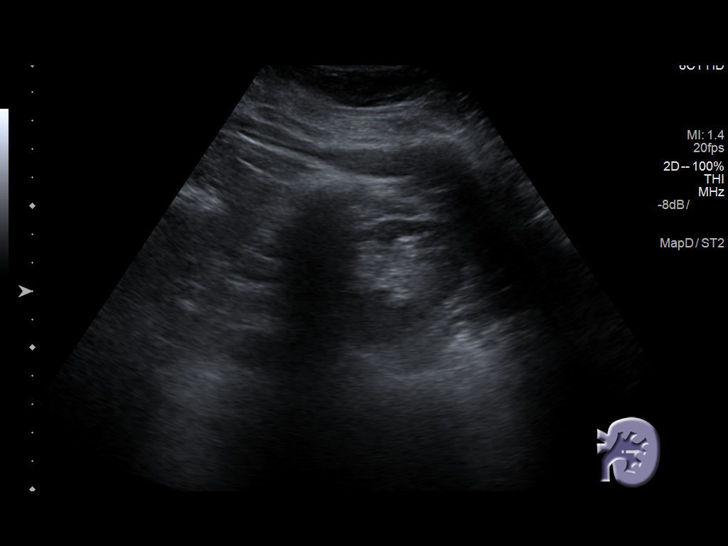
[im 92/92]
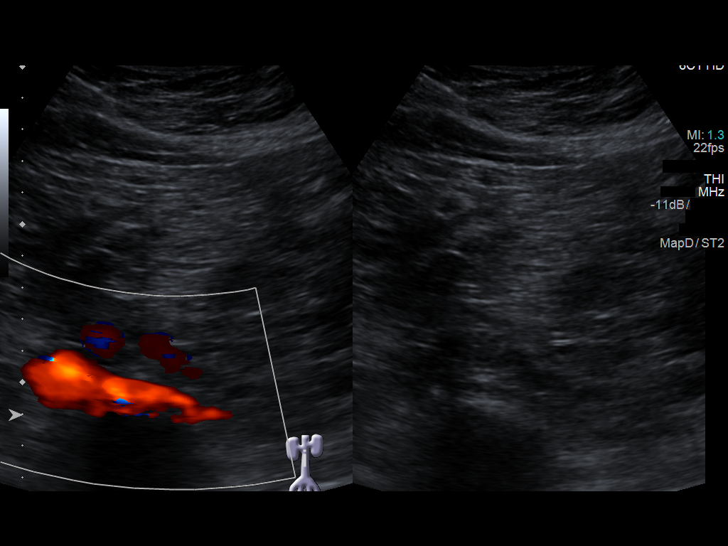

[14 of 25 positions shown; findings below may reference images not displayed]

FINDINGS: Gallbladder: No gallstones or wall thickening visualized. No
sonographic Murphy sign noted by sonographer.

Common bile duct: Diameter: 3 mm

Liver: Increased hepatic echogenicity without focal abnormality.
Portal vein is patent on color Doppler imaging with normal direction
of blood flow towards the liver.

IVC: No abnormality visualized.

Pancreas: Poorly visualized due to bowel gas.

Spleen: Size and appearance within normal limits.

Right Kidney: Length: 12.4 cm. Mild cortical thinning. No
hydronephrosis or focal abnormality.

Left Kidney: Length: 12.6 cm. Mild cortical thinning. No
hydronephrosis or focal abnormality.

Abdominal aorta: No aneurysm visualized.

Other findings: None.
IMPRESSION: 1. Increased hepatic echogenicity as may be seen with steatosis and
or hepatocellular disease. No focal hepatic abnormality.
2. Negative for gallstones or biliary dilatation

## 2018-11-10 IMAGING — US US PELVIS COMPLETE TRANSABD/TRANSVAG
1 series · 13 of 25 positions shown · non-contrast
Comparison: None

CLINICAL DATA: Postmenopausal bleeding

EXAM:
TRANSABDOMINAL AND TRANSVAGINAL ULTRASOUND OF PELVIS
TECHNIQUE: Both transabdominal and transvaginal ultrasound examinations of the
pelvis were performed. Transabdominal technique was performed for
global imaging of the pelvis including uterus, ovaries, adnexal
regions, and pelvic cul-de-sac. It was necessary to proceed with
endovaginal exam following the transabdominal exam to visualize the
uterus endometrium and ovaries.

[Series 1: us pelvis complete transabd/transvag · 0.22mm/px · 13 of 53 slices shown]
[im 1/53]
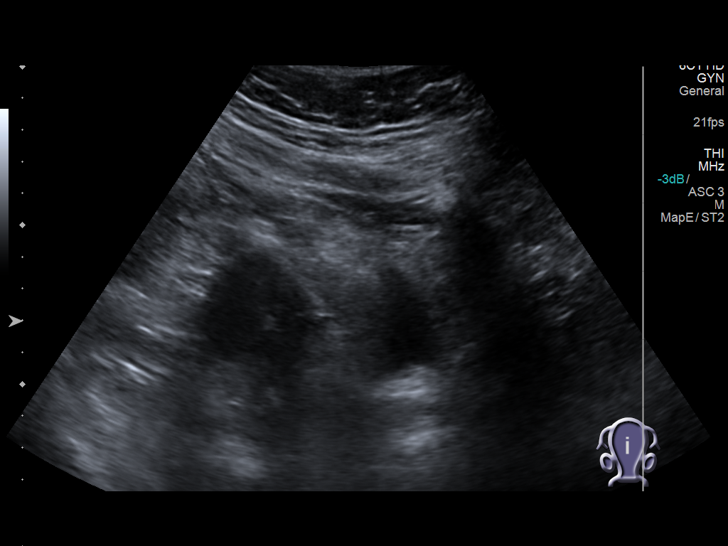
[im 5/53]
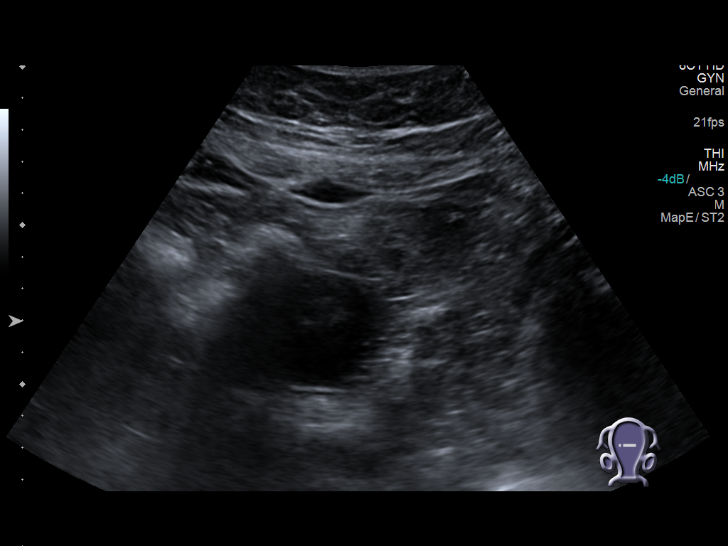
[im 9/53]
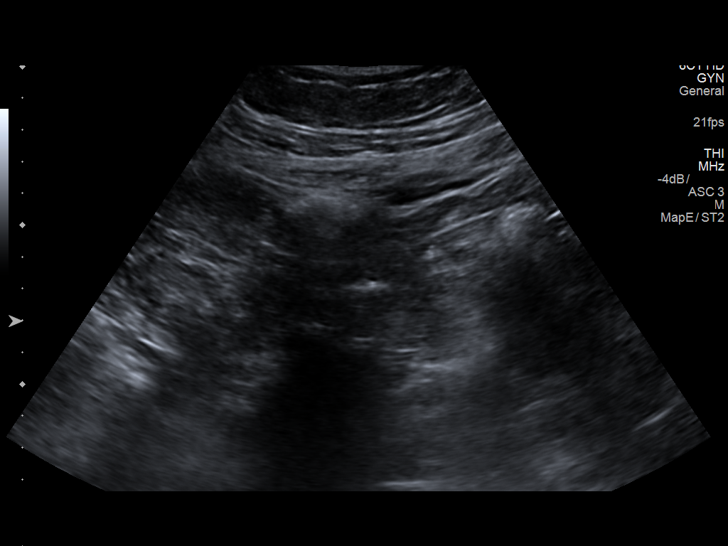
[im 14/53]
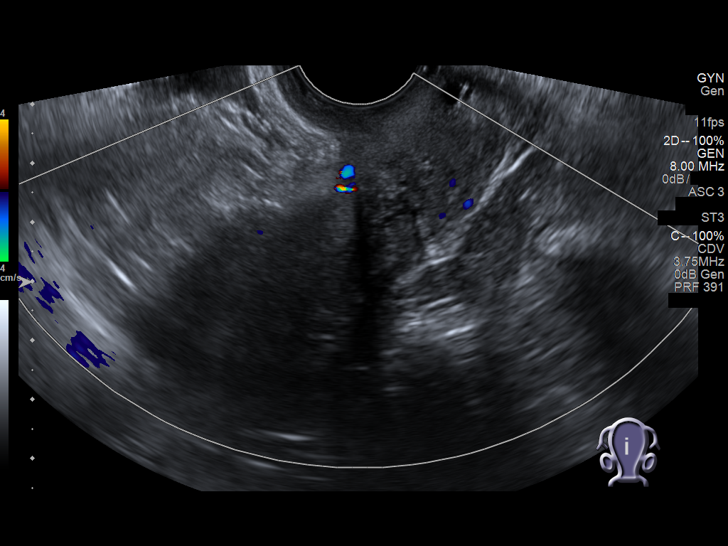
[im 18/53]
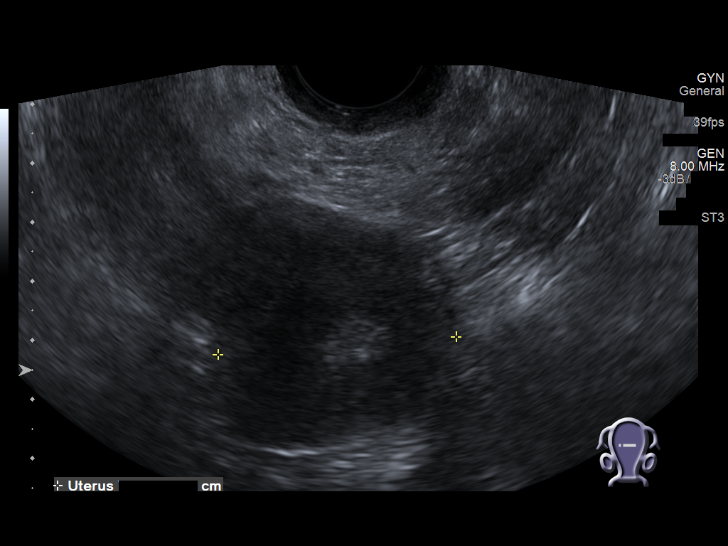
[im 22/53]
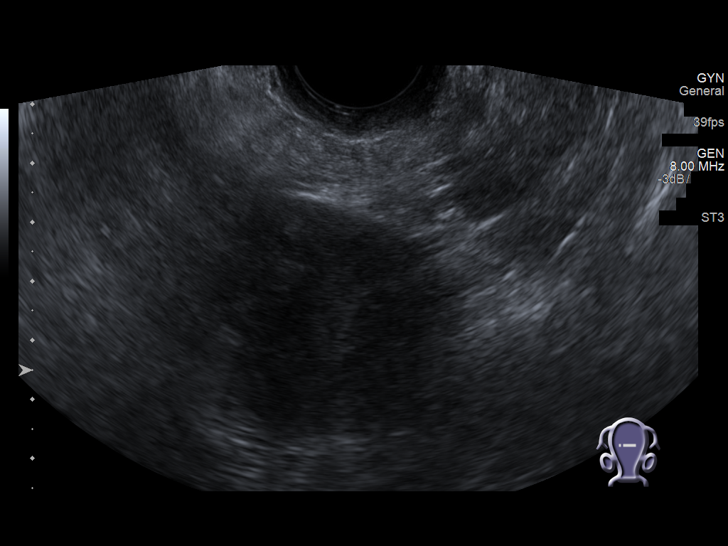
[im 27/53]
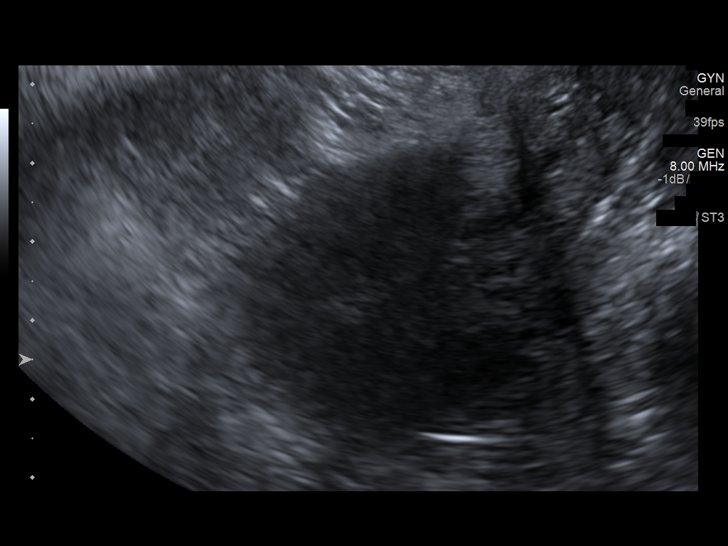
[im 31/53]
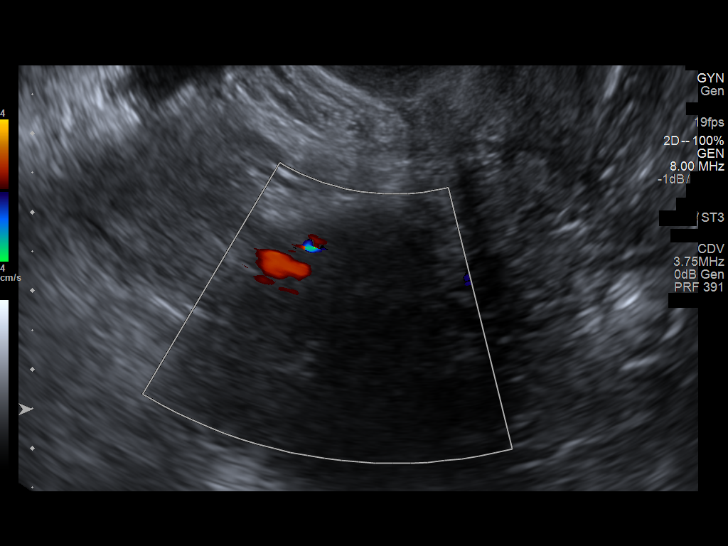
[im 35/53]
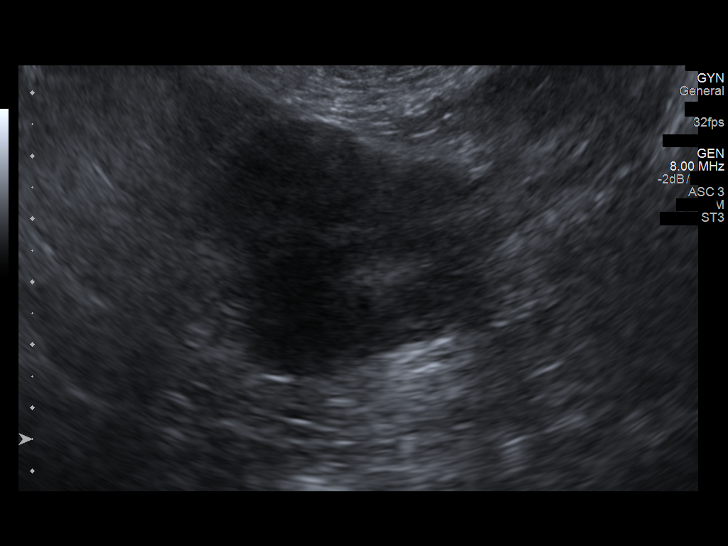
[im 40/53]
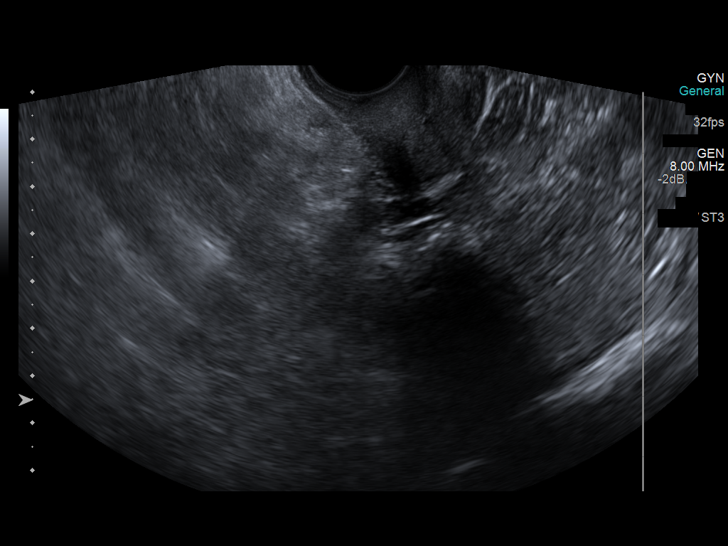
[im 44/53]
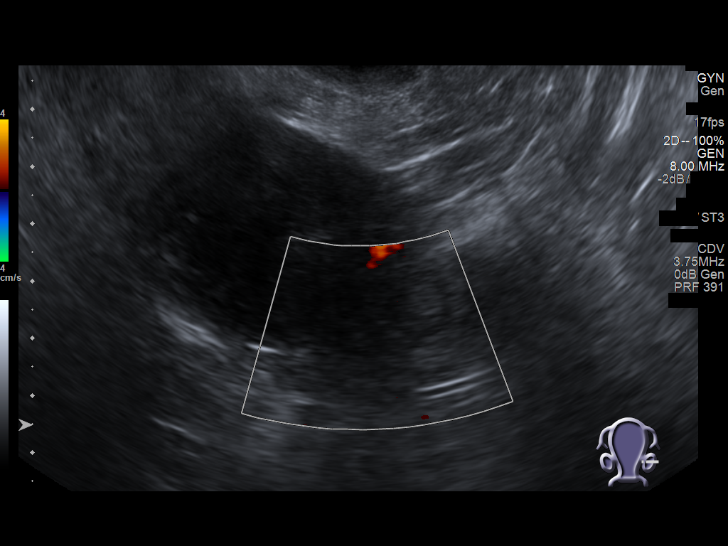
[im 48/53]
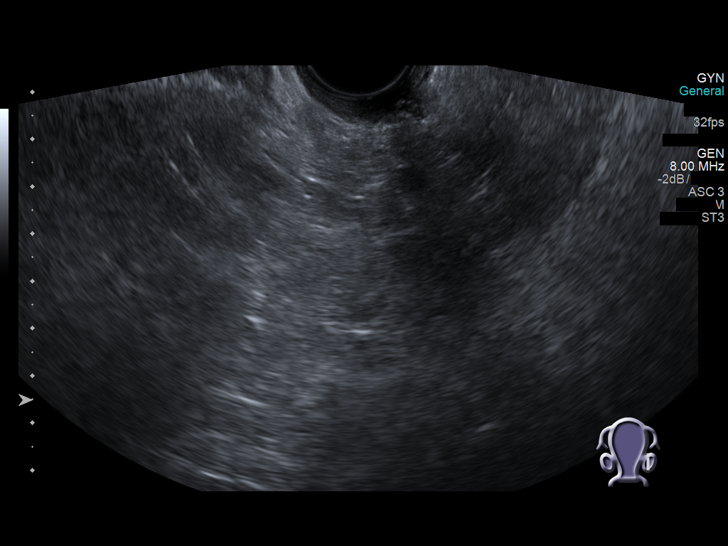
[im 53/53]
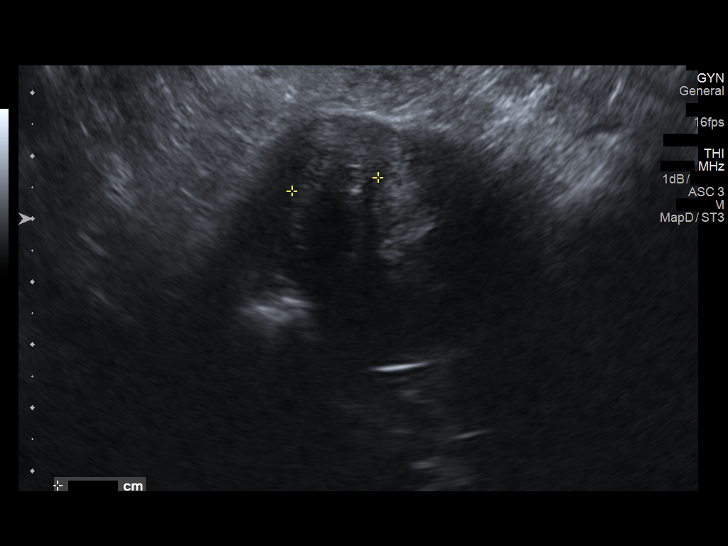

[13 of 25 positions shown; findings below may reference images not displayed]

FINDINGS: Uterus

Measurements: 7.9 x 3.5 x 4 cm. Anterior fundal mass measuring 2.2 x
1.9 x 1.4 cm

Endometrium

Thickness: 6 mm.  No focal abnormality visualized.

Right ovary

Measurements: 1.4 x 0.9 x 1.6 cm. Normal appearance/no adnexal mass.

Left ovary

Measurements: 1.9 x 1.3 x 1.4 cm. Normal appearance/no adnexal mass.

Other findings

No abnormal free fluid.
IMPRESSION: 1. Endometrial thickness of 6 mm. In the setting of post-menopausal
bleeding, endometrial sampling is indicated to exclude carcinoma. If
results are benign, sonohysterogram should be considered for focal
lesion work-up. (Ref: Radiological Reasoning: Algorithmic Workup of
Abnormal Vaginal Bleeding with Endovaginal Sonography and
Sonohysterography. AJR 1220; 191:[id number])
2. 2.2 cm uterine mass presumably a fibroid.

## 2019-07-09 ENCOUNTER — Other Ambulatory Visit: Payer: Self-pay | Admitting: Obstetrics & Gynecology

## 2019-07-13 ENCOUNTER — Other Ambulatory Visit: Payer: Self-pay | Admitting: Obstetrics & Gynecology

## 2020-06-30 ENCOUNTER — Other Ambulatory Visit (INDEPENDENT_AMBULATORY_CARE_PROVIDER_SITE_OTHER): Payer: Self-pay | Admitting: *Deleted

## 2020-06-30 ENCOUNTER — Other Ambulatory Visit: Payer: Self-pay

## 2020-06-30 ENCOUNTER — Encounter (INDEPENDENT_AMBULATORY_CARE_PROVIDER_SITE_OTHER): Payer: Self-pay | Admitting: Gastroenterology

## 2020-06-30 ENCOUNTER — Ambulatory Visit (INDEPENDENT_AMBULATORY_CARE_PROVIDER_SITE_OTHER): Payer: Medicare PPO | Admitting: Gastroenterology

## 2020-06-30 ENCOUNTER — Encounter (INDEPENDENT_AMBULATORY_CARE_PROVIDER_SITE_OTHER): Payer: Self-pay | Admitting: *Deleted

## 2020-06-30 VITALS — BP 143/83 | HR 65 | Temp 99.1°F | Ht 63.0 in | Wt 195.7 lb

## 2020-06-30 DIAGNOSIS — K219 Gastro-esophageal reflux disease without esophagitis: Secondary | ICD-10-CM

## 2020-06-30 DIAGNOSIS — R1310 Dysphagia, unspecified: Secondary | ICD-10-CM

## 2020-06-30 DIAGNOSIS — R1319 Other dysphagia: Secondary | ICD-10-CM

## 2020-06-30 NOTE — Progress Notes (Addendum)
Elaine Brady, M.D. Gastroenterology & Hepatology [facility] [address] [place], [place] [address] Primary Care Physician: Scherrie Bateman [address] [place] [address]  Referring MD: PCP  I will communicate my assessment and recommendations to the referring MD via EMR. Note: Occasional unusual wording and randomly placed punctuation marks may result from the use of speech recognition technology to transcribe this document"  Chief Complaint:  dysphagia  History of Present Illness: Elaine Brady is a 64 y.o. female with PMH depression, HLD, GERD, hypothyroidism, who presents for evaluation of dysphagia.  Patient reports having some dysphagia to both solids and liquids, as well as  discomfort in her throat for the last couple of months. Patient reports that the discomfort in her throat usually persists until she drinks any liquid that would make "the sensation go away".  The patient states that it does not happen with every single meal but is frequent.  Occasionally she has noticed that the food remains sitting in the middle of the chest without relief, but never had food impaction. Denies having to spit food to relieve the symptoms. No odynophagia. The patient reports that she has had a history of GERD for the last 10 years, for which she takes omeprazole 40 mg every day in the AM (sometimes after or sometimes before breakfast) - she reports it controls her heartburn sensation adequately. The patient denies having any nausea, vomiting, fever, chills, hematochezia, melena, hematemesis, abdominal distention, abdominal pain, diarrhea, jaundice, pruritus or weight loss.   The patient brings recent blood work-up. I personally reviewed and interpreted the available lab results from 05/13/2020 which showed CBC with normal cell lines well blood cell count 4.7, hemoglobin 13.3, platelets 240, CMP with mildly elevated ALT of 34,  normal AST 25, rest of hepatic panel within normal limits alkaline phosphatase 87, total bilirubin 0.3, normal electrolytes, renal function with creatinine 0.8 and BUN 14.  Last EGD: in the early 2000s per patient, reports there was some esophageal irritation Last Colonoscopy: 2016 - small polyp in proximal sigmoid colon (hyperplastic), small hemorrhoids. Recommended to repeat in 6 years  FHx: parents had hiatal hernia but neg for any gastrointestinal/liver disease, no malignancies Social: neg smoking, alcohol or illicit drug use Surgical: non contributory  Past Medical History: Past Medical History:  Diagnosis Date  . Allergy   . Depression   . GERD (gastroesophageal reflux disease)   . Heart murmur   . Thyroid disease     Past Surgical History: Past Surgical History:  Procedure Laterality Date  . BREAST SURGERY    . COLONOSCOPY N/A 10/12/2015   Procedure: COLONOSCOPY;  Surgeon: Rogene Houston, MD;  Location: [facility];  Service: Endoscopy;  Laterality: N/A;  930 - moved to 11/23 @ 9:30    Family History: Family History  Problem Relation Age of Onset  . Diabetes Mother   . Hyperlipidemia Mother   . Hypertension Mother   . Heart disease Father   . Diabetes Father   . Hyperlipidemia Sister   . Heart disease Sister   . Hyperlipidemia Brother   . Hypertension Brother   . Diabetes Brother   . Hyperlipidemia Brother   . Hyperlipidemia Brother     Social History: Social History   Tobacco Use  Smoking Status Never Smoker  Smokeless Tobacco Never Used   Social History   Substance and Sexual Activity  Alcohol Use No   Social History   Substance and Sexual Activity  Drug Use No    Allergies: Allergies  Allergen Reactions  . Bactrim [Sulfamethoxazole-Trimethoprim] Rash    Medications: Current Outpatient Medications  Medication Sig Dispense Refill  . atorvastatin (LIPITOR) 40 MG tablet Take 40 mg by mouth daily.  3  . FLUoxetine (PROZAC) 20 MG capsule  TAKE ONE CAPSULE BY MOUTH ONCE DAILY 30 capsule 0  . levothyroxine (SYNTHROID, LEVOTHROID) 50 MCG tablet Take 1 tablet (50 mcg total) by mouth daily before breakfast. (Patient taking differently: Take 100 mcg by mouth daily before breakfast. ) 60 tablet 0  . Magnesium 500 MG TABS Take 1 capsule by mouth daily.     . Multiple Vitamins-Minerals (CENTRUM SILVER ADULT 50+ PO) Take 1 tablet by mouth daily.     Marland Kitchen omeprazole (PRILOSEC) 40 MG capsule Take 1 capsule (40 mg total) by mouth daily. 90 capsule 0  . Probiotic Product (PROBIOTIC DAILY PO) Take 1 capsule by mouth daily.     Marland Kitchen estradiol (ESTRACE) 1 MG tablet Take 1 mg by mouth daily. (Patient not taking: Reported on 06/30/2020)  1  . progesterone (PROMETRIUM) 200 MG capsule Take 1 capsule by mouth once daily (Patient not taking: Reported on 06/30/2020) 90 capsule 3   No current facility-administered medications for this visit.    Review of Systems: GENERAL: negative for malaise, night sweats HEENT: No changes in hearing or vision, no nose bleeds or other nasal problems. NECK: Negative for lumps, goiter, pain and significant neck swelling RESPIRATORY: Negative for cough, wheezing CARDIOVASCULAR: Negative for chest pain, leg swelling, palpitations, orthopnea GI: SEE HPI MUSCULOSKELETAL: Negative for joint pain or swelling, back pain, and muscle pain. SKIN: Negative for lesions, rash PSYCH: Negative for sleep disturbance, mood disorder and recent psychosocial stressors. HEMATOLOGY Negative for prolonged bleeding, bruising easily, and swollen nodes. ENDOCRINE: Negative for cold or heat intolerance, polyuria, polydipsia and goiter. NEURO: negative for tremor, gait imbalance, syncope and seizures. The remainder of the review of systems is noncontributory.   Physical Exam: BP (!) 143/83 (BP Location: Right Arm, Patient Position: Sitting, Cuff Size: Normal)   Pulse 65   Temp 99.1 F (37.3 C) (Oral)   Ht 5\' 3"  (1.6 m)   Wt 195 lb 11.2 oz (88.8  kg)   BMI 34.67 kg/m  GENERAL: The patient is AO x3, in no acute distress.  Obese. HEENT: Head is normocephalic and atraumatic. EOMI are intact. Mouth is well hydrated and without lesions. NECK: Supple. No masses LUNGS: Clear to auscultation. No presence of rhonchi/wheezing/rales. Adequate chest expansion HEART: RRR, normal s1 and s2. ABDOMEN: Soft, nontender, no guarding, no peritoneal signs, and nondistended. BS +. No masses. EXTREMITIES: Without any cyanosis, clubbing, rash, lesions or edema. NEUROLOGIC: AOx3, no focal motor deficit. SKIN: no jaundice, no rashes  Imaging/Labs: as above  I personally reviewed and interpreted the available labs, imaging and endoscopic files.  Impression and Plan: Elaine Brady is a 64 y.o. female with PMH depression, HLD, GERD, hypothyroidism, who presents for evaluation of dysphagia.  The patient has presented recurrent episodes of dysphagia and throat discomfort recently without presence of any other red flag signs such as weight loss or inability to tolerate feedings.  She has a history of chronic GERD which has had symptomatic control with the use of PPIs.  However she has not had an EGD for many years, for which we will proceed with an EGD to evaluate her anatomy and potentially dilate any concomitant strictures leading to her symptomatology.  If no narrowing is present we will  proceed with esophageal biopsies to rule out EOE, although this remains lower in the differential.  At that time a modified barium swallow will be considered.  For now she needs to continue her PPI as usual.  She was counseled about the importance of taking her PPI adequately as she is sometimes taking it after meals and that decreases the efficacy of the medication -in fact, this could explain part of her symptoms as she may have intermittent episodes of reflux due to suboptimal treatment.  Finally, I gave her lifestyle advice to the patient to improve her heartburn episodes.  She  is due for screening colonoscopy next year.  - Schedule EGD  -Continue with omeprazole 40 mg qday -Explained presumed etiology of reflux symptoms. Instruction provided in the use of antireflux medication - patient should take medication in the morning 30-45 minutes before eating breakfast. Discussed avoidance of eating within 2 hours of lying down to sleep and benefit of blocks to elevate head of bed. -Patient encouraged to lose weight as this may provide benefit for her specific complaint but also due to other comorbidities  All questions were answered.      Elaine Peppers, MD Gastroenterology and Hepatology [facility]

## 2020-06-30 NOTE — Patient Instructions (Signed)
Schedule EGD  Continue with omeprazole 40 mg qday Explained presumed etiology of reflux symptoms. Instruction provided in the use of antireflux medication - patient should take medication in the morning 30-45 minutes before eating breakfast. Discussed avoidance of eating within 2 hours of lying down to sleep and benefit of blocks to elevate head of bed.

## 2020-07-04 ENCOUNTER — Other Ambulatory Visit (HOSPITAL_COMMUNITY)
Admission: RE | Admit: 2020-07-04 | Discharge: 2020-07-04 | Disposition: A | Discharge status: Home / Self Care | Payer: Medicare PPO | Source: Ambulatory Visit | Attending: Gastroenterology | Admitting: Gastroenterology

## 2020-07-04 ENCOUNTER — Other Ambulatory Visit: Payer: Self-pay

## 2020-07-04 DIAGNOSIS — Z01812 Encounter for preprocedural laboratory examination: Secondary | ICD-10-CM | POA: Diagnosis not present

## 2020-07-04 DIAGNOSIS — Z20822 Contact with and (suspected) exposure to covid-19: Secondary | ICD-10-CM | POA: Diagnosis not present

## 2020-07-05 ENCOUNTER — Other Ambulatory Visit: Payer: Self-pay

## 2020-07-05 ENCOUNTER — Encounter (HOSPITAL_COMMUNITY)
Admission: RE | Admit: 2020-07-05 | Discharge: 2020-07-05 | Disposition: A | Discharge status: Home / Self Care | Payer: Medicare PPO | Source: Ambulatory Visit | Attending: Gastroenterology | Admitting: Gastroenterology

## 2020-07-05 LAB — SARS CORONAVIRUS 2 (TAT 6-24 HRS): SARS Coronavirus 2: NEGATIVE

## 2020-07-05 NOTE — Patient Instructions (Signed)
Niyanna Asch  07/05/2020     @PREFPERIOPPHARMACY @   Your procedure is scheduled on  07/06/2020.  Report to [facility] at  0830  A.M.  Call this number if you have problems the morning of surgery:  [phone]   Remember:  Follow the diet instructions given to you by the office.                      Take these medicines the morning of surgery with A SIP OF WATER euthyrox, prozac, levothyroxine, prilosec.    Do not wear jewelry, make-up or nail polish.  Do not wear lotions, powders, or perfumes. Please wear deodorant and brush your teeth.  Do not shave 48 hours prior to surgery.  Men may shave face and neck.  Do not bring valuables to the hospital.  [facility] is not responsible for any belongings or valuables.  Contacts, dentures or bridgework may not be worn into surgery.  Leave your suitcase in the car.  After surgery it may be brought to your room.  For patients admitted to the hospital, discharge time will be determined by your treatment team.  Patients discharged the day of surgery will not be allowed to drive home.   Name and phone number of your driver:   family Special instructions:  DO NOT smoke the day of your procedure.  Please read over the following fact sheets that you were given. Anesthesia Post-op Instructions and Care and Recovery After Surgery       Upper Endoscopy, Adult, Care After This sheet gives you information about how to care for yourself after your procedure. Your health care provider may also give you more specific instructions. If you have problems or questions, contact your health care provider. What can I expect after the procedure? After the procedure, it is common to have:  A sore throat.  Mild stomach pain or discomfort.  Bloating.  Nausea. Follow these instructions at home:   Follow instructions from your health care provider about what to eat or drink after your procedure.  Return to your normal activities as told  by your health care provider. Ask your health care provider what activities are safe for you.  Take over-the-counter and prescription medicines only as told by your health care provider.  Do not drive for 24 hours if you were given a sedative during your procedure.  Keep all follow-up visits as told by your health care provider. This is important. Contact a health care provider if you have:  A sore throat that lasts longer than one day.  Trouble swallowing. Get help right away if:  You vomit blood or your vomit looks like coffee grounds.  You have: ? A fever. ? Bloody, black, or tarry stools. ? A severe sore throat or you cannot swallow. ? Difficulty breathing. ? Severe pain in your chest or abdomen. Summary  After the procedure, it is common to have a sore throat, mild stomach discomfort, bloating, and nausea.  Do not drive for 24 hours if you were given a sedative during the procedure.  Follow instructions from your health care provider about what to eat or drink after your procedure.  Return to your normal activities as told by your health care provider. This information is not intended to replace advice given to you by your health care provider. Make sure you discuss any questions you have with your health care provider. Document Revised: 04/29/2018 Document Reviewed: 04/07/2018  Elsevier Patient Education  2020 [place] After These instructions provide you with information about caring for yourself after your procedure. Your health care provider may also give you more specific instructions. Your treatment has been planned according to current medical practices, but problems sometimes occur. Call your health care provider if you have any problems or questions after your procedure. What can I expect after the procedure? After your procedure, you may:  Feel sleepy for several hours.  Feel clumsy and have poor balance for several  hours.  Feel forgetful about what happened after the procedure.  Have poor judgment for several hours.  Feel nauseous or vomit.  Have a sore throat if you had a breathing tube during the procedure. Follow these instructions at home: For at least 24 hours after the procedure:      Have a responsible adult stay with you. It is important to have someone help care for you until you are awake and alert.  Rest as needed.  Do not: ? Participate in activities in which you could fall or become injured. ? Drive. ? Use heavy machinery. ? Drink alcohol. ? Take sleeping pills or medicines that cause drowsiness. ? Make important decisions or sign legal documents. ? Take care of children on your own. Eating and drinking  Follow the diet that is recommended by your health care provider.  If you vomit, drink water, juice, or soup when you can drink without vomiting.  Make sure you have little or no nausea before eating solid foods. General instructions  Take over-the-counter and prescription medicines only as told by your health care provider.  If you have sleep apnea, surgery and certain medicines can increase your risk for breathing problems. Follow instructions from your health care provider about wearing your sleep device: ? Anytime you are sleeping, including during daytime naps. ? While taking prescription pain medicines, sleeping medicines, or medicines that make you drowsy.  If you smoke, do not smoke without supervision.  Keep all follow-up visits as told by your health care provider. This is important. Contact a health care provider if:  You keep feeling nauseous or you keep vomiting.  You feel light-headed.  You develop a rash.  You have a fever. Get help right away if:  You have trouble breathing. Summary  For several hours after your procedure, you may feel sleepy and have poor judgment.  Have a responsible adult stay with you for at least 24 hours or until  you are awake and alert. This information is not intended to replace advice given to you by your health care provider. Make sure you discuss any questions you have with your health care provider. Document Revised: 02/03/2018 Document Reviewed: 02/26/2016 Elsevier Patient Education  [place].

## 2020-07-06 ENCOUNTER — Ambulatory Visit (HOSPITAL_COMMUNITY)
Admission: RE | Admit: 2020-07-06 | Discharge: 2020-07-06 | Disposition: A | Discharge status: Home / Self Care | Payer: Medicare PPO | Attending: Gastroenterology | Admitting: Gastroenterology

## 2020-07-06 ENCOUNTER — Ambulatory Visit (HOSPITAL_COMMUNITY): Payer: Medicare PPO | Admitting: Anesthesiology

## 2020-07-06 ENCOUNTER — Other Ambulatory Visit: Payer: Self-pay

## 2020-07-06 ENCOUNTER — Encounter (HOSPITAL_COMMUNITY): Payer: Self-pay | Admitting: Gastroenterology

## 2020-07-06 ENCOUNTER — Encounter (HOSPITAL_COMMUNITY)
Admission: RE | Disposition: A | Discharge status: Home / Self Care | Payer: Self-pay | Source: Home / Self Care | Attending: Gastroenterology

## 2020-07-06 DIAGNOSIS — K219 Gastro-esophageal reflux disease without esophagitis: Secondary | ICD-10-CM | POA: Insufficient documentation

## 2020-07-06 DIAGNOSIS — E039 Hypothyroidism, unspecified: Secondary | ICD-10-CM | POA: Diagnosis not present

## 2020-07-06 DIAGNOSIS — K317 Polyp of stomach and duodenum: Secondary | ICD-10-CM | POA: Insufficient documentation

## 2020-07-06 DIAGNOSIS — F329 Major depressive disorder, single episode, unspecified: Secondary | ICD-10-CM | POA: Diagnosis not present

## 2020-07-06 DIAGNOSIS — E785 Hyperlipidemia, unspecified: Secondary | ICD-10-CM | POA: Diagnosis not present

## 2020-07-06 DIAGNOSIS — R1310 Dysphagia, unspecified: Secondary | ICD-10-CM

## 2020-07-06 DIAGNOSIS — Z79890 Hormone replacement therapy (postmenopausal): Secondary | ICD-10-CM | POA: Insufficient documentation

## 2020-07-06 DIAGNOSIS — Z79899 Other long term (current) drug therapy: Secondary | ICD-10-CM | POA: Insufficient documentation

## 2020-07-06 HISTORY — PX: BIOPSY: SHX5522

## 2020-07-06 HISTORY — PX: ESOPHAGOGASTRODUODENOSCOPY (EGD) WITH PROPOFOL: SHX5813

## 2020-07-06 SURGERY — ESOPHAGOGASTRODUODENOSCOPY (EGD) WITH PROPOFOL
Anesthesia: General

## 2020-07-06 MED ORDER — LIDOCAINE VISCOUS HCL 2 % MT SOLN
OROMUCOSAL | Status: AC
  Filled 2020-07-06: qty 15

## 2020-07-06 MED ORDER — STERILE WATER FOR IRRIGATION IR SOLN
Status: DC | PRN
  Administered 2020-07-06: 1.5 mL

## 2020-07-06 MED ORDER — PROPOFOL 10 MG/ML IV BOLUS
INTRAVENOUS | Status: AC
  Filled 2020-07-06: qty 200

## 2020-07-06 MED ORDER — PROPOFOL 500 MG/50ML IV EMUL
INTRAVENOUS | Status: DC | PRN
  Administered 2020-07-06: 175 ug/kg/min via INTRAVENOUS
  Administered 2020-07-06: 100 mg via INTRAVENOUS

## 2020-07-06 MED ORDER — LACTATED RINGERS IV SOLN: INTRAVENOUS | Status: DC | PRN

## 2020-07-06 MED ORDER — LIDOCAINE VISCOUS HCL 2 % MT SOLN
15.0000 mL | Freq: Once | OROMUCOSAL | Status: AC
  Administered 2020-07-06: 15 mL via OROMUCOSAL

## 2020-07-06 MED ORDER — LACTATED RINGERS IV SOLN: Freq: Once | INTRAVENOUS | Status: AC

## 2020-07-06 MED ORDER — LIDOCAINE HCL (CARDIAC) PF 50 MG/5ML IV SOSY
PREFILLED_SYRINGE | INTRAVENOUS | Status: DC | PRN
  Administered 2020-07-06: 100 mg via INTRAVENOUS

## 2020-07-06 MED ORDER — GLYCOPYRROLATE 0.2 MG/ML IJ SOLN
0.2000 mg | Freq: Once | INTRAMUSCULAR | Status: AC
  Administered 2020-07-06: 0.2 mg via INTRAVENOUS

## 2020-07-06 MED ORDER — GLYCOPYRROLATE 0.2 MG/ML IJ SOLN
INTRAMUSCULAR | Status: AC
  Filled 2020-07-06: qty 1

## 2020-07-06 NOTE — Discharge Instructions (Signed)
Upper Endoscopy, Adult, Care After This sheet gives you information about how to care for yourself after your procedure. Your health care provider may also give you more specific instructions. If you have problems or questions, contact your health care provider. What can I expect after the procedure? After the procedure, it is common to have:  A sore throat.  Mild stomach pain or discomfort.  Bloating.  Nausea. Follow these instructions at home:   Follow instructions from your health care provider about what to eat or drink after your procedure.  Return to your normal activities as told by your health care provider. Ask your health care provider what activities are safe for you.  Take over-the-counter and prescription medicines only as told by your health care provider.  Do not drive for 24 hours if you were given a sedative during your procedure.  Keep all follow-up visits as told by your health care provider. This is important. Contact a health care provider if you have:  A sore throat that lasts longer than one day.  Trouble swallowing. Get help right away if:  You vomit blood or your vomit looks like coffee grounds.  You have: ? A fever. ? Bloody, black, or tarry stools. ? A severe sore throat or you cannot swallow. ? Difficulty breathing. ? Severe pain in your chest or abdomen. Summary  After the procedure, it is common to have a sore throat, mild stomach discomfort, bloating, and nausea.  Do not drive for 24 hours if you were given a sedative during the procedure.  Follow instructions from your health care provider about what to eat or drink after your procedure.  Return to your normal activities as told by your health care provider. This information is not intended to replace advice given to you by your health care provider. Make sure you discuss any questions you have with your health care provider. Document Revised: 04/29/2018 Document Reviewed:  04/07/2018 Elsevier Patient Education  2020 [place] are being discharged to home.  Resume your previous diet.  We are waiting for your pathology results.  May consider esophageal manometry if biopsies are normal.

## 2020-07-06 NOTE — Transfer of Care (Signed)
Immediate Anesthesia Transfer of Care Note  Patient: Elaine Brady  Procedure(s) Performed: ESOPHAGOGASTRODUODENOSCOPY (EGD) WITH PROPOFOL (N/A ) BIOPSY  Patient Location: Endoscopy Unit  Anesthesia Type:General  Level of Consciousness: awake, alert , oriented and patient cooperative  Airway & Oxygen Therapy: Patient Spontanous Breathing and Patient connected to nasal cannula oxygen  Post-op Assessment: Report given to RN, Post -op Vital signs reviewed and stable and Patient moving all extremities  Post vital signs: Reviewed and stable  Last Vitals:  Vitals Value Taken Time  BP    Temp    Pulse    Resp    SpO2      Last Pain:  Vitals:   07/06/20 1055  TempSrc:   PainSc: 0-No pain      Patients Stated Pain Goal: 8 (72/09/19 8022)  Complications: No complications documented.

## 2020-07-06 NOTE — Op Note (Signed)
[facility] Patient Name: Elaine Brady Procedure Date: 07/06/2020 10:48 AM MRN: [id number] Date of Birth: 19-Oct-1956 Attending MD: Maylon Peppers ,  CSN: [id number] Age: 64 Admit Type: Outpatient Procedure:                Upper GI endoscopy Indications:              Dysphagia Providers:                Maylon Peppers, Jeanann Lewandowsky. Sharon Seller, RN, Caprice Kluver, Raphael Gibney, Technician Referring MD:              Medicines:                Monitored Anesthesia Care Complications:            No immediate complications. Estimated Blood Loss:     Estimated blood loss: none. Procedure:                Pre-Anesthesia Assessment:                           - Prior to the procedure, a History and Physical                            was performed, and patient medications, allergies                            and sensitivities were reviewed. The patient's                            tolerance of previous anesthesia was reviewed.                           - The risks and benefits of the procedure and the                            sedation options and risks were discussed with the                            patient. All questions were answered and informed                            consent was obtained.                           - ASA Grade Assessment: II - A patient with mild                            systemic disease.                           After obtaining informed consent, the endoscope was                            passed under direct vision. Throughout the  procedure, the patient's blood pressure, pulse, and                            oxygen saturations were monitored continuously. The                            [id number]-[id number] ([id number]) was introduced through the                            mouth, and advanced to the second part of duodenum.                            The upper GI endoscopy was accomplished without                             difficulty. The patient tolerated the procedure                            well. Scope In: 11:00:39 AM Scope Out: 11:09:59 AM Total Procedure Duration: 0 hours 9 minutes 20 seconds  Findings:      Esophagogastric landmarks were identified: the Z-line was found at 38 cm       from the incisors.      The examined esophagus was normal. Biopsies were obtained from the       proximal and distal esophagus with cold forceps for histology of       suspected eosinophilic esophagitis.      A few 2 to 3 mm sessile polyps with no bleeding were found in the       gastric fundus.      The examined duodenum was normal. Impression:               - Esophagogastric landmarks identified.                           - Normal esophagus. Biopsied.                           - A few gastric polyps.                           - Normal examined duodenum. Moderate Sedation:      Per Anesthesia Care Recommendation:           - Discharge patient to home (ambulatory).                           - Resume previous diet.                           - Await pathology results.                           - May consider esophageal manometry if biopsies are                            normal. Procedure Code(s):        --- Professional ---  44315, GC, Esophagogastroduodenoscopy, flexible,                            transoral; with biopsy, single or multiple Diagnosis Code(s):        --- Professional ---                           K31.7, Polyp of stomach and duodenum                           R13.10, Dysphagia, unspecified CPT copyright 2019 American Medical Association. All rights reserved. The codes documented in this report are preliminary and upon coder review may  be revised to meet current compliance requirements. Maylon Peppers, MD Maylon Peppers,  07/06/2020 11:13:36 AM This report has been signed electronically. Number of Addenda: 0

## 2020-07-06 NOTE — Anesthesia Postprocedure Evaluation (Signed)
Anesthesia Post Note  Patient: Elaine Brady  Procedure(s) Performed: ESOPHAGOGASTRODUODENOSCOPY (EGD) WITH PROPOFOL (N/A ) BIOPSY  Patient location during evaluation: Endoscopy Anesthesia Type: General Level of consciousness: awake, oriented, awake and alert and patient cooperative Pain management: pain level controlled Vital Signs Assessment: post-procedure vital signs reviewed and stable Respiratory status: spontaneous breathing, respiratory function stable, nonlabored ventilation and patient connected to nasal cannula oxygen Cardiovascular status: blood pressure returned to baseline and stable Postop Assessment: no headache and no backache Anesthetic complications: no   No complications documented.   Last Vitals:  Vitals:   07/06/20 0849  BP: (!) 147/70  Pulse: 67  Resp: 17  Temp: 37.2 C  SpO2: 98%    Last Pain:  Vitals:   07/06/20 1055  TempSrc:   PainSc: 0-No pain                 Tacy Learn

## 2020-07-06 NOTE — Anesthesia Preprocedure Evaluation (Signed)
Anesthesia Evaluation  Patient identified by MRN, date of birth, ID band Patient awake    Reviewed: Allergy & Precautions, NPO status , Patient's Chart, lab work & pertinent test results  History of Anesthesia Complications Negative for: history of anesthetic complications  Airway Mallampati: II  TM Distance: >3 FB Neck ROM: Full    Dental  (+) Teeth Intact, Dental Advisory Given   Pulmonary neg pulmonary ROS,    Pulmonary exam normal breath sounds clear to auscultation       Cardiovascular negative cardio ROS Normal cardiovascular exam+ Valvular Problems/Murmurs  Rhythm:Regular Rate:Normal     Neuro/Psych negative neurological ROS     GI/Hepatic Neg liver ROS, GERD  Medicated and Controlled,  Endo/Other  Hypothyroidism   Renal/GU negative Renal ROS  negative genitourinary   Musculoskeletal negative musculoskeletal ROS (+)   Abdominal   Peds  Hematology negative hematology ROS (+)   Anesthesia Other Findings   Reproductive/Obstetrics negative OB ROS                             Anesthesia Physical Anesthesia Plan  ASA: II  Anesthesia Plan: General   Post-op Pain Management:    Induction: Intravenous  PONV Risk Score and Plan: TIVA  Airway Management Planned: Nasal Cannula and Natural Airway  Additional Equipment:   Intra-op Plan:   Post-operative Plan:   Informed Consent: I have reviewed the patients History and Physical, chart, labs and discussed the procedure including the risks, benefits and alternatives for the proposed anesthesia with the patient or authorized representative who has indicated his/her understanding and acceptance.     Dental advisory given  Plan Discussed with: CRNA and Surgeon  Anesthesia Plan Comments:         Anesthesia Quick Evaluation

## 2020-07-06 NOTE — H&P (Signed)
Elaine Brady is an 64 y.o. female.   Chief Complaint:Dysphagia HPI: Briefly, this is a 64 year old female with past medical history of hyperlipidemia, GERD, hypothyroidism and depression, who came to the hospital for evaluation of dysphagia.  She reports having dysphagia intermittently for solids and liquids for the last 2 months.  She has also noticed some discomfort in her throat that improves when she increases her intake of liquids.  Has felt that food sits in her chest for a short period of time occasionally, less than a minute.  Denies any odynophagia, weight loss or any other symptom.  She has recently changed the way she takes omeprazole 40 mg every day.  Her last EGD was performed in early 2000's, she reported having some mild esophageal irritation.  Past Medical History:  Diagnosis Date   Allergy    Depression    GERD (gastroesophageal reflux disease)    Heart murmur    Thyroid disease     Past Surgical History:  Procedure Laterality Date   BREAST SURGERY     COLONOSCOPY N/A 10/12/2015   Procedure: COLONOSCOPY;  Surgeon: Rogene Houston, MD;  Location: [facility];  Service: Endoscopy;  Laterality: N/A;  930 - moved to 11/23 @ 9:30    Family History  Problem Relation Age of Onset   Diabetes Mother    Hyperlipidemia Mother    Hypertension Mother    Heart disease Father    Diabetes Father    Hyperlipidemia Sister    Heart disease Sister    Hyperlipidemia Brother    Hypertension Brother    Diabetes Brother    Hyperlipidemia Brother    Hyperlipidemia Brother    Social History:  reports that she has never smoked. She has never used smokeless tobacco. She reports that she does not drink alcohol and does not use drugs.  Allergies:  Allergies  Allergen Reactions   Bactrim [Sulfamethoxazole-Trimethoprim] Rash    Medications Prior to Admission  Medication Sig Dispense Refill   atorvastatin (LIPITOR) 40 MG tablet Take 40 mg by mouth daily.  3    CALCIUM-VITAMIN D PO Take 1 tablet by mouth daily.     EUTHYROX 75 MCG tablet Take 75 mcg by mouth daily before breakfast.     FLUoxetine (PROZAC) 20 MG tablet Take 20 mg by mouth daily.     Magnesium 500 MG TABS Take 1 capsule by mouth daily.      Multiple Vitamins-Minerals (CENTRUM SILVER ADULT 50+ PO) Take 1 tablet by mouth daily.      omeprazole (PRILOSEC) 40 MG capsule Take 1 capsule (40 mg total) by mouth daily. 90 capsule 0   estradiol (ESTRACE) 1 MG tablet Take 1 mg by mouth daily. (Patient not taking: Reported on 06/30/2020)  1   FLUoxetine (PROZAC) 20 MG capsule TAKE ONE CAPSULE BY MOUTH ONCE DAILY (Patient not taking: Reported on 07/01/2020) 30 capsule 0   levothyroxine (SYNTHROID, LEVOTHROID) 50 MCG tablet Take 1 tablet (50 mcg total) by mouth daily before breakfast. (Patient taking differently: Take 100 mcg by mouth daily before breakfast. ) 60 tablet 0   Probiotic Product (PROBIOTIC DAILY PO) Take 1 capsule by mouth daily.  (Patient not taking: Reported on 07/01/2020)     progesterone (PROMETRIUM) 200 MG capsule Take 1 capsule by mouth once daily (Patient not taking: Reported on 06/30/2020) 90 capsule 3    Results for orders placed or performed during the hospital encounter of 07/04/20 (from the past 48 hour(s))  SARS CORONAVIRUS 2 (TAT  6-24 HRS) Nasopharyngeal Nasopharyngeal Swab     Status: None   Collection Time: 07/04/20 12:30 PM   Specimen: Nasopharyngeal Swab  Result Value Ref Range   SARS Coronavirus 2 NEGATIVE NEGATIVE    Comment: (NOTE) SARS-CoV-2 target nucleic acids are NOT DETECTED.  The SARS-CoV-2 RNA is generally detectable in upper and lower respiratory specimens during the acute phase of infection. Negative results do not preclude SARS-CoV-2 infection, do not rule out co-infections with other pathogens, and should not be used as the sole basis for treatment or other patient management decisions. Negative results must be combined with clinical  observations, patient history, and epidemiological information. The expected result is Negative.  Fact Sheet for Patients: SugarRoll.be  Fact Sheet for Healthcare Providers: https://www.woods-mathews.com/  This test is not yet approved or cleared by the [place] FDA and  has been authorized for detection and/or diagnosis of SARS-CoV-2 by FDA under an Emergency Use Authorization (EUA). This EUA will remain  in effect (meaning this test can be used) for the duration of the COVID-19 declaration under Se ction 564(b)(1) of the Act, 21 U.S.C. section 360bbb-3(b)(1), unless the authorization is terminated or revoked sooner.  Performed at [facility], [place] [address] [place], [place] [address]    No results found.  Review of Systems  Constitutional: Negative.   HENT: Positive for trouble swallowing.   Eyes: Negative.   Respiratory: Negative.   Cardiovascular: Negative.   Gastrointestinal: Negative.   Endocrine: Negative.   Genitourinary: Negative.   Musculoskeletal: Negative.   Skin: Negative.   Allergic/Immunologic: Negative.   Neurological: Negative.   Hematological: Negative.   Psychiatric/Behavioral: Negative.     Blood pressure (!) 147/70, pulse 67, temperature 99 F (37.2 C), temperature source Oral, resp. rate 17, height 5\' 3"  (1.6 m), weight 87.1 kg, SpO2 98 %. Physical Exam  GENERAL: The patient is AO x3, in no acute distress. Obese. HEENT: Head is normocephalic and atraumatic. EOMI are intact. Mouth is well hydrated and without lesions. NECK: Supple. No masses LUNGS: Clear to auscultation. No presence of rhonchi/wheezing/rales. Adequate chest expansion HEART: RRR, normal s1 and s2. ABDOMEN: Soft, nontender, no guarding, no peritoneal signs, and nondistended. BS +. No masses. EXTREMITIES: Without any cyanosis, clubbing, rash, lesions or edema. NEUROLOGIC: AOx3, no focal motor deficit. SKIN: no jaundice, no  rashes  Assessment/Plan 64 year old female with past medical history of hyperlipidemia, GERD, hypothyroidism and depression, who came to the hospital for evaluation of dysphagia.  We will proceed with EGD today for further evaluation of her symptoms.  Harvel Quale, MD 07/06/2020, 10:48 AM

## 2020-07-08 ENCOUNTER — Telehealth (INDEPENDENT_AMBULATORY_CARE_PROVIDER_SITE_OTHER): Payer: Self-pay | Admitting: Gastroenterology

## 2020-07-08 LAB — SURGICAL PATHOLOGY

## 2020-07-08 NOTE — Telephone Encounter (Signed)
I reviewed the pathology results. I called the patient and informed  about the results. Pathology showed normal esophageal mucosa negative for EoE, no need to repeat EGD.  I explained to the patient that since she was still having symptoms while taking the PPI on a daily basis and make the correct fashion, we will need to proceed with esophageal manometry and pH impedance testing to determine if her symptoms are related to any dysmotility or persistent reflux despite being on PPI.  Patient reported that she would like to discuss this with her husband further before proceeding with this testing.  Thanks,  Maylon Peppers, MD Gastroenterology and Hepatology [facility]

## 2020-07-11 ENCOUNTER — Encounter (HOSPITAL_COMMUNITY): Payer: Self-pay | Admitting: Gastroenterology

## 2020-07-12 NOTE — Telephone Encounter (Signed)
Is patient going to call me or do I need to call her

## 2020-07-12 NOTE — Telephone Encounter (Signed)
She will call us back. Thanks

## 2020-08-02 DIAGNOSIS — G4733 Obstructive sleep apnea (adult) (pediatric): Secondary | ICD-10-CM | POA: Diagnosis not present

## 2020-08-11 DIAGNOSIS — G4733 Obstructive sleep apnea (adult) (pediatric): Secondary | ICD-10-CM | POA: Diagnosis not present

## 2020-09-01 DIAGNOSIS — G4733 Obstructive sleep apnea (adult) (pediatric): Secondary | ICD-10-CM | POA: Diagnosis not present

## 2020-09-12 DIAGNOSIS — E039 Hypothyroidism, unspecified: Secondary | ICD-10-CM | POA: Diagnosis not present

## 2020-10-02 DIAGNOSIS — G4733 Obstructive sleep apnea (adult) (pediatric): Secondary | ICD-10-CM | POA: Diagnosis not present

## 2020-11-01 DIAGNOSIS — G4733 Obstructive sleep apnea (adult) (pediatric): Secondary | ICD-10-CM | POA: Diagnosis not present

## 2020-11-10 DIAGNOSIS — G4733 Obstructive sleep apnea (adult) (pediatric): Secondary | ICD-10-CM | POA: Diagnosis not present

## 2020-12-02 DIAGNOSIS — G4733 Obstructive sleep apnea (adult) (pediatric): Secondary | ICD-10-CM | POA: Diagnosis not present

## 2021-01-02 DIAGNOSIS — G4733 Obstructive sleep apnea (adult) (pediatric): Secondary | ICD-10-CM | POA: Diagnosis not present

## 2021-01-30 DIAGNOSIS — G4733 Obstructive sleep apnea (adult) (pediatric): Secondary | ICD-10-CM | POA: Diagnosis not present

## 2021-02-08 DIAGNOSIS — G4733 Obstructive sleep apnea (adult) (pediatric): Secondary | ICD-10-CM | POA: Diagnosis not present

## 2021-05-04 DIAGNOSIS — Z1389 Encounter for screening for other disorder: Secondary | ICD-10-CM | POA: Diagnosis not present

## 2021-05-04 DIAGNOSIS — M8580 Other specified disorders of bone density and structure, unspecified site: Secondary | ICD-10-CM | POA: Diagnosis not present

## 2021-05-04 DIAGNOSIS — E6609 Other obesity due to excess calories: Secondary | ICD-10-CM | POA: Diagnosis not present

## 2021-05-04 DIAGNOSIS — E039 Hypothyroidism, unspecified: Secondary | ICD-10-CM | POA: Diagnosis not present

## 2021-05-04 DIAGNOSIS — R7301 Impaired fasting glucose: Secondary | ICD-10-CM | POA: Diagnosis not present

## 2021-05-04 DIAGNOSIS — Z0000 Encounter for general adult medical examination without abnormal findings: Secondary | ICD-10-CM | POA: Diagnosis not present

## 2021-05-04 DIAGNOSIS — Z6835 Body mass index (BMI) 35.0-35.9, adult: Secondary | ICD-10-CM | POA: Diagnosis not present

## 2021-05-04 DIAGNOSIS — I10 Essential (primary) hypertension: Secondary | ICD-10-CM | POA: Diagnosis not present

## 2021-05-04 DIAGNOSIS — E782 Mixed hyperlipidemia: Secondary | ICD-10-CM | POA: Diagnosis not present

## 2021-05-11 DIAGNOSIS — G4733 Obstructive sleep apnea (adult) (pediatric): Secondary | ICD-10-CM | POA: Diagnosis not present

## 2021-05-11 DIAGNOSIS — Z1231 Encounter for screening mammogram for malignant neoplasm of breast: Secondary | ICD-10-CM | POA: Diagnosis not present

## 2021-07-02 DIAGNOSIS — G4733 Obstructive sleep apnea (adult) (pediatric): Secondary | ICD-10-CM | POA: Diagnosis not present

## 2021-08-02 DIAGNOSIS — G4733 Obstructive sleep apnea (adult) (pediatric): Secondary | ICD-10-CM | POA: Diagnosis not present

## 2021-08-11 DIAGNOSIS — G4733 Obstructive sleep apnea (adult) (pediatric): Secondary | ICD-10-CM | POA: Diagnosis not present

## 2021-09-01 DIAGNOSIS — G4733 Obstructive sleep apnea (adult) (pediatric): Secondary | ICD-10-CM | POA: Diagnosis not present

## 2021-10-16 ENCOUNTER — Encounter (INDEPENDENT_AMBULATORY_CARE_PROVIDER_SITE_OTHER): Payer: Self-pay | Admitting: *Deleted

## 2021-12-26 ENCOUNTER — Telehealth (INDEPENDENT_AMBULATORY_CARE_PROVIDER_SITE_OTHER): Payer: Self-pay | Admitting: *Deleted

## 2021-12-26 ENCOUNTER — Encounter (INDEPENDENT_AMBULATORY_CARE_PROVIDER_SITE_OTHER): Payer: Self-pay | Admitting: *Deleted

## 2021-12-26 MED ORDER — PEG 3350-KCL-NA BICARB-NACL 420 G PO SOLR: 4000.0000 mL | Freq: Once | ORAL | 0 refills | Status: AC

## 2021-12-26 NOTE — Telephone Encounter (Signed)
Patient needs trilyte 

## 2021-12-26 NOTE — Telephone Encounter (Signed)
Referring MD/PCP: Delman Cheadle  Procedure: tcs  Reason/Indication:  screening  Has patient had this procedure before?  Yes, 2016  If so, when, by whom and where?    Is there a family history of colon cancer?  no  Who?  What age when diagnosed?    Is patient diabetic? If yes, Type 1 or Type 2   no      Does patient have prosthetic heart valve or mechanical valve?  no  Do you have a pacemaker/defibrillator?  no  Has patient ever had endocarditis/atrial fibrillation? no  Does patient use oxygen? no  Has patient had joint replacement within last 12 months?  no  Is patient constipated or do they take laxatives? no  Does patient have a history of alcohol/drug use?  no  Have you had a stroke/heart attack last 6 mths? no  Do you take medicine for weight loss?  no  For female patients,: have you had a hysterectomy                       are you post menopausal                       do you still have your menstrual cycle no  Is patient on blood thinner such as Coumadin, Plavix and/or Aspirin? no  Medications: mvi daily, calcium 1200 mg daily, magnesium 400 mg daily, probiotic daily, levothyroxine 75 mcg daily, fluoxetine 20 mg daily, atorvastatin 40 mg daily, omeprazole 40 mg daily  Allergies: bactrim  Medication Adjustment per Dr Rehman/Dr Jenetta Downer   Procedure date & time: 01/17/22

## 2021-12-28 ENCOUNTER — Other Ambulatory Visit (INDEPENDENT_AMBULATORY_CARE_PROVIDER_SITE_OTHER): Payer: Self-pay

## 2021-12-28 DIAGNOSIS — Z1211 Encounter for screening for malignant neoplasm of colon: Secondary | ICD-10-CM

## 2022-01-12 NOTE — Patient Instructions (Signed)
Elaine Brady  01/12/2022     @PREFPERIOPPHARMACY @   Your procedure is scheduled on 01/17/2022.   Report to [facility] at  [email or web address].   Call this number if you have problems the morning of surgery:  [phone]   Remember:  Follow the diet and prep instructions given to you by the office.    Take these medicines the morning of surgery with A SIP OF WATER                   euthyrox, prozac, synthroid, prilosec.     Do not wear jewelry, make-up or nail polish.  Do not wear lotions, powders, or perfumes, or deodorant.  Do not shave 48 hours prior to surgery.  Men may shave face and neck.  Do not bring valuables to the hospital.  [facility] is not responsible for any belongings or valuables.  Contacts, dentures or bridgework may not be worn into surgery.  Leave your suitcase in the car.  After surgery it may be brought to your room.  For patients admitted to the hospital, discharge time will be determined by your treatment team.  Patients discharged the day of surgery will not be allowed to drive home and must have someone with them for 24 hours.    Special instructions:   DO NOT smoke tobacco or vape for 24 hours before your procedure.  Please read over the following fact sheets that you were given. Anesthesia Post-op Instructions and Care and Recovery After Surgery      Colonoscopy, Adult, Care After This sheet gives you information about how to care for yourself after your procedure. Your health care provider may also give you more specific instructions. If you have problems or questions, contact your health care provider. What can I expect after the procedure? After the procedure, it is common to have: A small amount of blood in your stool for 24 hours after the procedure. Some gas. Mild cramping or bloating of your abdomen. Follow these instructions at home: Eating and drinking  Drink enough fluid to keep your urine pale yellow. Follow instructions  from your health care provider about eating or drinking restrictions. Resume your normal diet as instructed by your health care provider. Avoid heavy or fried foods that are hard to digest. Activity Rest as told by your health care provider. Avoid sitting for a long time without moving. Get up to take short walks every 1-2 hours. This is important to improve blood flow and breathing. Ask for help if you feel weak or unsteady. Return to your normal activities as told by your health care provider. Ask your health care provider what activities are safe for you. Managing cramping and bloating  Try walking around when you have cramps or feel bloated. Apply heat to your abdomen as told by your health care provider. Use the heat source that your health care provider recommends, such as a moist heat pack or a heating pad. Place a towel between your skin and the heat source. Leave the heat on for 20-30 minutes. Remove the heat if your skin turns bright red. This is especially important if you are unable to feel pain, heat, or cold. You may have a greater risk of getting burned. General instructions If you were given a sedative during the procedure, it can affect you for several hours. Do not drive or operate machinery until your health care provider says that it is safe. For the  first 24 hours after the procedure: Do not sign important documents. Do not drink alcohol. Do your regular daily activities at a slower pace than normal. Eat soft foods that are easy to digest. Take over-the-counter and prescription medicines only as told by your health care provider. Keep all follow-up visits as told by your health care provider. This is important. Contact a health care provider if: You have blood in your stool 2-3 days after the procedure. Get help right away if you have: More than a small spotting of blood in your stool. Large blood clots in your stool. Swelling of your abdomen. Nausea or vomiting. A  fever. Increasing pain in your abdomen that is not relieved with medicine. Summary After the procedure, it is common to have a small amount of blood in your stool. You may also have mild cramping and bloating of your abdomen. If you were given a sedative during the procedure, it can affect you for several hours. Do not drive or operate machinery until your health care provider says that it is safe. Get help right away if you have a lot of blood in your stool, nausea or vomiting, a fever, or increased pain in your abdomen. This information is not intended to replace advice given to you by your health care provider. Make sure you discuss any questions you have with your health care provider. Document Revised: 09/11/2019 Document Reviewed: 06/01/2019 Elsevier Patient Education  [place] After This sheet gives you information about how to care for yourself after your procedure. Your health care provider may also give you more specific instructions. If you have problems or questions, contact your health care provider. What can I expect after the procedure? After the procedure, it is common to have: Tiredness. Forgetfulness about what happened after the procedure. Impaired judgment for important decisions. Nausea or vomiting. Some difficulty with balance. Follow these instructions at home: For the time period you were told by your health care provider:   Rest as needed. Do not participate in activities where you could fall or become injured. Do not drive or use machinery. Do not drink alcohol. Do not take sleeping pills or medicines that cause drowsiness. Do not make important decisions or sign legal documents. Do not take care of children on your own. Eating and drinking Follow the diet that is recommended by your health care provider. Drink enough fluid to keep your urine pale yellow. If you vomit: Drink water, juice, or soup when you can drink  without vomiting. Make sure you have little or no nausea before eating solid foods. General instructions Have a responsible adult stay with you for the time you are told. It is important to have someone help care for you until you are awake and alert. Take over-the-counter and prescription medicines only as told by your health care provider. If you have sleep apnea, surgery and certain medicines can increase your risk for breathing problems. Follow instructions from your health care provider about wearing your sleep device: Anytime you are sleeping, including during daytime naps. While taking prescription pain medicines, sleeping medicines, or medicines that make you drowsy. Avoid smoking. Keep all follow-up visits as told by your health care provider. This is important. Contact a health care provider if: You keep feeling nauseous or you keep vomiting. You feel light-headed. You are still sleepy or having trouble with balance after 24 hours. You develop a rash. You have a fever. You have redness or swelling around the  IV site. Get help right away if: You have trouble breathing. You have new-onset confusion at home. Summary For several hours after your procedure, you may feel tired. You may also be forgetful and have poor judgment. Have a responsible adult stay with you for the time you are told. It is important to have someone help care for you until you are awake and alert. Rest as told. Do not drive or operate machinery. Do not drink alcohol or take sleeping pills. Get help right away if you have trouble breathing, or if you suddenly become confused. This information is not intended to replace advice given to you by your health care provider. Make sure you discuss any questions you have with your health care provider. Document Revised: 07/21/2020 Document Reviewed: 10/08/2019 Elsevier Patient Education  2022 [employer].

## 2022-01-15 ENCOUNTER — Encounter (HOSPITAL_COMMUNITY)
Admission: RE | Admit: 2022-01-15 | Discharge: 2022-01-15 | Disposition: A | Discharge status: Home / Self Care | Payer: Medicare PPO | Source: Ambulatory Visit | Attending: Internal Medicine | Admitting: Internal Medicine

## 2022-01-15 ENCOUNTER — Encounter (HOSPITAL_COMMUNITY): Payer: Self-pay

## 2022-01-15 HISTORY — DX: Hypothyroidism, unspecified: E03.9

## 2022-01-15 HISTORY — DX: Sleep apnea, unspecified: G47.30

## 2022-01-17 ENCOUNTER — Ambulatory Visit (HOSPITAL_BASED_OUTPATIENT_CLINIC_OR_DEPARTMENT_OTHER): Payer: Medicare PPO | Admitting: Anesthesiology

## 2022-01-17 ENCOUNTER — Encounter (HOSPITAL_COMMUNITY)
Admission: RE | Disposition: A | Discharge status: Home / Self Care | Payer: Self-pay | Source: Home / Self Care | Attending: Internal Medicine

## 2022-01-17 ENCOUNTER — Encounter (INDEPENDENT_AMBULATORY_CARE_PROVIDER_SITE_OTHER): Payer: Self-pay | Admitting: *Deleted

## 2022-01-17 ENCOUNTER — Ambulatory Visit (HOSPITAL_COMMUNITY): Payer: Medicare PPO | Admitting: Anesthesiology

## 2022-01-17 ENCOUNTER — Ambulatory Visit (HOSPITAL_COMMUNITY)
Admission: RE | Admit: 2022-01-17 | Discharge: 2022-01-17 | Disposition: A | Discharge status: Home / Self Care | Payer: Medicare PPO | Attending: Internal Medicine | Admitting: Internal Medicine

## 2022-01-17 ENCOUNTER — Encounter (HOSPITAL_COMMUNITY): Payer: Self-pay | Admitting: Internal Medicine

## 2022-01-17 DIAGNOSIS — K6389 Other specified diseases of intestine: Secondary | ICD-10-CM | POA: Insufficient documentation

## 2022-01-17 DIAGNOSIS — Z86010 Personal history of colonic polyps: Secondary | ICD-10-CM

## 2022-01-17 DIAGNOSIS — K644 Residual hemorrhoidal skin tags: Secondary | ICD-10-CM

## 2022-01-17 DIAGNOSIS — K5730 Diverticulosis of large intestine without perforation or abscess without bleeding: Secondary | ICD-10-CM | POA: Diagnosis not present

## 2022-01-17 DIAGNOSIS — G4730 Sleep apnea, unspecified: Secondary | ICD-10-CM | POA: Diagnosis not present

## 2022-01-17 DIAGNOSIS — F32A Depression, unspecified: Secondary | ICD-10-CM | POA: Insufficient documentation

## 2022-01-17 DIAGNOSIS — K219 Gastro-esophageal reflux disease without esophagitis: Secondary | ICD-10-CM | POA: Insufficient documentation

## 2022-01-17 DIAGNOSIS — Z1211 Encounter for screening for malignant neoplasm of colon: Secondary | ICD-10-CM | POA: Insufficient documentation

## 2022-01-17 DIAGNOSIS — Z09 Encounter for follow-up examination after completed treatment for conditions other than malignant neoplasm: Secondary | ICD-10-CM | POA: Diagnosis not present

## 2022-01-17 DIAGNOSIS — E039 Hypothyroidism, unspecified: Secondary | ICD-10-CM | POA: Diagnosis not present

## 2022-01-17 HISTORY — PX: BIOPSY: SHX5522

## 2022-01-17 HISTORY — PX: COLONOSCOPY WITH PROPOFOL: SHX5780

## 2022-01-17 LAB — HM COLONOSCOPY

## 2022-01-17 SURGERY — COLONOSCOPY WITH PROPOFOL
Anesthesia: General

## 2022-01-17 MED ORDER — SIMETHICONE 40 MG/0.6ML PO SUSP
ORAL | Status: DC | PRN
  Administered 2022-01-17: 50 mL

## 2022-01-17 MED ORDER — LACTATED RINGERS IV SOLN: INTRAVENOUS | Status: DC

## 2022-01-17 MED ORDER — PROPOFOL 10 MG/ML IV BOLUS
INTRAVENOUS | Status: DC | PRN
  Administered 2022-01-17: 50 mg via INTRAVENOUS
  Administered 2022-01-17 (×2): 20 mg via INTRAVENOUS
  Administered 2022-01-17: 120 mg via INTRAVENOUS
  Administered 2022-01-17 (×2): 50 mg via INTRAVENOUS

## 2022-01-17 NOTE — Anesthesia Postprocedure Evaluation (Signed)
Anesthesia Post Note ? ?Patient: Elaine Brady ? ?Procedure(s) Performed: COLONOSCOPY WITH PROPOFOL ?BIOPSY ? ?Patient location during evaluation: Phase II ?Anesthesia Type: General ?Level of consciousness: awake and alert and oriented ?Pain management: pain level controlled ?Vital Signs Assessment: post-procedure vital signs reviewed and stable ?Respiratory status: spontaneous breathing, nonlabored ventilation and respiratory function stable ?Cardiovascular status: blood pressure returned to baseline and stable ?Postop Assessment: no apparent nausea or vomiting ?Anesthetic complications: no ? ? ?No notable events documented. ? ? ?Last Vitals:  ?Vitals:  ? 01/17/22 0940 01/17/22 1037  ?BP: (!) 153/70 (!) 116/58  ?Pulse: 61 63  ?Resp: 18 18  ?Temp: 37.1 ?C 37 ?C  ?SpO2: 100% 97%  ?  ?Last Pain:  ?Vitals:  ? 01/17/22 1037  ?TempSrc: Oral  ?PainSc: 0-No pain  ? ? ?  ?  ?  ?  ?  ?  ? ?[name] ? ? ? ? ?

## 2022-01-17 NOTE — Discharge Instructions (Signed)
No aspirin or NSAIDs for 24 hours.   °Resume usual medications as before. °High-fiber diet. °No driving for 24 hours. °Physician will call with biopsy results. °

## 2022-01-17 NOTE — Op Note (Signed)
[facility] ?Patient Name: Elaine Brady ?Procedure Date: 01/17/2022 9:53 AM ?MRN: [id number] ?Date of Birth: 14-Sep-1956 ?Attending MD: Hildred Laser , MD ?CSN: [id number] ?Age: 66 ?Admit Type: Outpatient ?Procedure:                Colonoscopy ?Indications:              High risk colon cancer surveillance: Personal  ?                          history of colonic polyps ?Providers:                Hildred Laser, MD, Climax Page, Wynonia Musty  ?                          Tech, Technician ?Referring MD:             Delman Cheadle, PA-C ?Medicines:                Propofol per Anesthesia ?Complications:            No immediate complications. ?Estimated Blood Loss:     Estimated blood loss was minimal. ?Procedure:                Pre-Anesthesia Assessment: ?                          - Prior to the procedure, a History and Physical  ?                          was performed, and patient medications and  ?                          allergies were reviewed. The patient's tolerance of  ?                          previous anesthesia was also reviewed. The risks  ?                          and benefits of the procedure and the sedation  ?                          options and risks were discussed with the patient.  ?                          All questions were answered, and informed consent  ?                          was obtained. Prior Anticoagulants: The patient has  ?                          taken no previous anticoagulant or antiplatelet  ?                          agents. ASA Grade Assessment: II - A patient with  ?  mild systemic disease. After reviewing the risks  ?                          and benefits, the patient was deemed in  ?                          satisfactory condition to undergo the procedure. ?                          After obtaining informed consent, the colonoscope  ?                          was passed under direct vision. Throughout the  ?                          procedure,  the patient's blood pressure, pulse, and  ?                          oxygen saturations were monitored continuously. The  ?                          PCF-HQ190L ([id number]) scope was introduced through  ?                          the anus and advanced to the the cecum, identified  ?                          by appendiceal orifice and ileocecal valve. The  ?                          colonoscopy was performed without difficulty. The  ?                          patient tolerated the procedure well. The quality  ?                          of the bowel preparation was excellent. The  ?                          ileocecal valve, appendiceal orifice, and rectum  ?                          were photographed. ?Scope In: 10:14:38 AM ?Scope Out: 10:33:05 AM ?Scope Withdrawal Time: 0 hours 12 minutes 2 seconds  ?Total Procedure Duration: 0 hours 18 minutes 27 seconds  ?Findings: ?     The perianal and digital rectal examinations were normal. ?     Multiple petechiae were found in the descending colon, at the splenic  ?     flexure, in the transverse colon and in the ascending colon. This was  ?     biopsied with a cold forceps for histology. The pathology specimen was  ?     placed into Bottle Number 1. ?     Scattered diverticula were found in the sigmoid colon. ?     External hemorrhoids were found  during retroflexion. The hemorrhoids  ?     were small. ?Impression:               - Petechia(e) in the descending colon, at the  ?                          splenic flexure, in the transverse colon and in the  ?                          ascending colon. Biopsied. ?                          - Diverticulosis in the sigmoid colon. ?                          - External hemorrhoids. ?Moderate Sedation: ?     Per Anesthesia Care ?Recommendation:           - Patient has a contact number available for  ?                          emergencies. The signs and symptoms of potential  ?                          delayed complications were discussed  with the  ?                          patient. Return to normal activities tomorrow.  ?                          Written discharge instructions were provided to the  ?                          patient. ?                          - High fiber diet today. ?                          - Continue present medications. ?                          - No aspirin, ibuprofen, naproxen, or other  ?                          non-steroidal anti-inflammatory drugs for 1 day. ?                          - Await pathology results. ?                          - Repeat colonoscopy in 5 years for surveillance. ?Procedure Code(s):        --- Professional --- ?                          [phone], Colonoscopy, flexible; with biopsy, single  ?  or multiple ?Diagnosis Code(s):        --- Professional --- ?                          Z86.010, Personal history of colonic polyps ?                          K63.89, Other specified diseases of intestine ?                          K64.4, Residual hemorrhoidal skin tags ?                          K57.30, Diverticulosis of large intestine without  ?                          perforation or abscess without bleeding ?CPT copyright 2019 [employer]. All rights reserved. ?The codes documented in this report are preliminary and upon coder review may  ?be revised to meet current compliance requirements. ?Hildred Laser, MD ?Hildred Laser, MD ?01/17/2022 10:41:28 AM ?This report has been signed electronically. ?Number of Addenda: 0 ?

## 2022-01-17 NOTE — Transfer of Care (Addendum)
1040Immediate Anesthesia Transfer of Care Note ? ?Patient: Cerissa Zeiger ? ?Procedure(s) Performed: COLONOSCOPY WITH PROPOFOL ?BIOPSY ? ?Patient Location: Short Stay ? ?Anesthesia Type:General ? ?Level of Consciousness: awake and patient cooperative ? ?Airway & Oxygen Therapy: Patient Spontanous Breathing ? ?Post-op Assessment: Report given to RN and Post -op Vital signs reviewed and stable ? ?Post vital signs: Reviewed and stable ? ?Last Vitals:  ?Vitals Value Taken Time  ?BP 116/58 1040  ?Temp 98.6 1040  ?Pulse    ?Resp    ?SpO2    ? ? ?Last Pain:  ?Vitals:  ? 01/17/22 1010  ?TempSrc:   ?PainSc: 0-No pain  ?   ? ?Patients Stated Pain Goal: 5 (01/17/22 0940) ? ?Complications: No notable events documented. ?

## 2022-01-17 NOTE — Anesthesia Preprocedure Evaluation (Signed)
Anesthesia Evaluation  ?Patient identified by MRN, date of birth, ID band ?Patient awake ? ? ? ?Reviewed: ?Allergy & Precautions, NPO status , Patient's Chart, lab work & pertinent test results ? ?Airway ?Mallampati: III ? ?TM Distance: >3 FB ?Neck ROM: Full ? ? ? Dental ? ?(+) Dental Advisory Given, Teeth Intact ?  ?Pulmonary ?sleep apnea and Continuous Positive Airway Pressure Ventilation ,  ?  ?Pulmonary exam normal ?breath sounds clear to auscultation ? ? ? ? ? ? Cardiovascular ?Normal cardiovascular exam+ Valvular Problems/Murmurs  ?Rhythm:Regular Rate:Normal ? ? ?  ?Neuro/Psych ?PSYCHIATRIC DISORDERS Depression negative neurological ROS ?   ? GI/Hepatic ?Neg liver ROS, GERD  Medicated and Controlled,  ?Endo/Other  ?Hypothyroidism  ? Renal/GU ?negative Renal ROS  ?negative genitourinary ?  ?Musculoskeletal ?negative musculoskeletal ROS ?(+)  ? Abdominal ?  ?Peds ?negative pediatric ROS ?(+)  Hematology ?negative hematology ROS ?(+)   ?Anesthesia Other Findings ? ? Reproductive/Obstetrics ?negative OB ROS ? ?  ? ? ? ? ? ? ? ? ? ? ? ? ? ?  ?  ? ? ? ? ? ? ? ?Anesthesia Physical ?Anesthesia Plan ? ?ASA: 2 ? ?Anesthesia Plan: General  ? ?Post-op Pain Management: Minimal or no pain anticipated  ? ?Induction: Intravenous ? ?PONV Risk Score and Plan: TIVA ? ?Airway Management Planned: Nasal Cannula and Natural Airway ? ?Additional Equipment:  ? ?Intra-op Plan:  ? ?Post-operative Plan:  ? ?Informed Consent: I have reviewed the patients History and Physical, chart, labs and discussed the procedure including the risks, benefits and alternatives for the proposed anesthesia with the patient or authorized representative who has indicated his/her understanding and acceptance.  ? ? ? ?Dental advisory given ? ?Plan Discussed with: CRNA and Surgeon ? ?Anesthesia Plan Comments:   ? ? ? ? ? ? ?Anesthesia Quick Evaluation ? ?

## 2022-01-17 NOTE — H&P (Signed)
Elaine Brady is an 66 y.o. female.   ?Chief Complaint: Patient is here for colonoscopy. ?HPI: Patient is 66 year old Caucasian female who has a history of colonic polyp is here for surveillance colonoscopy.  She had a large tubulovillous adenoma removed in 2004.  Subsequent exams have been negative.  Last exam was in November 2016.  Patient denies abdominal pain change in bowel habits or rectal bleeding. ?Family history is negative for colorectal carcinoma. ? ?Past Medical History:  ?Diagnosis Date  ? Allergy   ? Depression   ? GERD (gastroesophageal reflux disease)   ? Heart murmur   ? Hypothyroidism   ? Sleep apnea   ? Thyroid disease   ? ? ?Past Surgical History:  ?Procedure Laterality Date  ? BIOPSY  07/06/2020  ? Procedure: BIOPSY;  Surgeon: Harvel Quale, MD;  Location: [facility];  Service: Gastroenterology;;  ? BREAST SURGERY    ? COLONOSCOPY N/A 10/12/2015  ? Procedure: COLONOSCOPY;  Surgeon: Rogene Houston, MD;  Location: [facility];  Service: Endoscopy;  Laterality: N/A;  930 - moved to 11/23 @ 9:30  ? ESOPHAGOGASTRODUODENOSCOPY (EGD) WITH PROPOFOL N/A 07/06/2020  ? Procedure: ESOPHAGOGASTRODUODENOSCOPY (EGD) WITH PROPOFOL;  Surgeon: Harvel Quale, MD;  Location: [facility];  Service: Gastroenterology;  Laterality: N/A;  1015  ? ? ?Family History  ?Problem Relation Age of Onset  ? Diabetes Mother   ? Hyperlipidemia Mother   ? Hypertension Mother   ? Heart disease Father   ? Diabetes Father   ? Hyperlipidemia Sister   ? Heart disease Sister   ? Hyperlipidemia Brother   ? Hypertension Brother   ? Diabetes Brother   ? Hyperlipidemia Brother   ? Hyperlipidemia Brother   ? ?Social History:  reports that she has never smoked. She has never used smokeless tobacco. She reports that she does not drink alcohol and does not use drugs. ? ?Allergies:  ?Allergies  ?Allergen Reactions  ? Bactrim [Sulfamethoxazole-Trimethoprim] Rash  ? ? ?Medications Prior to Admission  ?Medication Sig  Dispense Refill  ? atorvastatin (LIPITOR) 40 MG tablet Take 40 mg by mouth daily.  3  ? CALCIUM-VITAMIN D PO Take 1 tablet by mouth daily.    ? FLUoxetine (PROZAC) 20 MG tablet Take 20 mg by mouth daily.    ? levothyroxine (SYNTHROID, LEVOTHROID) 50 MCG tablet Take 1 tablet (50 mcg total) by mouth daily before breakfast. 60 tablet 0  ? Magnesium 400 MG TABS Take 400 mg by mouth daily.    ? Multiple Vitamins-Minerals (CENTRUM SILVER ADULT 50+ PO) Take 1 tablet by mouth daily.     ? omeprazole (PRILOSEC) 40 MG capsule Take 1 capsule (40 mg total) by mouth daily. 90 capsule 0  ? Probiotic Product (PROBIOTIC DAILY PO) Take 1 capsule by mouth daily.    ? EUTHYROX 75 MCG tablet Take 75 mcg by mouth daily before breakfast.    ? FLUoxetine (PROZAC) 20 MG capsule TAKE ONE CAPSULE BY MOUTH ONCE DAILY (Patient not taking: Reported on 01/10/2022) 30 capsule 0  ? progesterone (PROMETRIUM) 200 MG capsule Take 1 capsule by mouth once daily (Patient not taking: No sig reported) 90 capsule 3  ? ? ?No results found for this or any previous visit (from the past 48 hour(s)). ?No results found. ? ?Review of Systems ? ?Blood pressure (!) 153/70, pulse 61, temperature 98.7 ?F (37.1 ?C), temperature source Oral, resp. rate 18, SpO2 100 %. ?Physical Exam ?HENT:  ?   Mouth/Throat:  ?  Mouth: Mucous membranes are moist.  ?   Pharynx: Oropharynx is clear.  ?Eyes:  ?   General: No scleral icterus. ?   Conjunctiva/sclera: Conjunctivae normal.  ?Cardiovascular:  ?   Rate and Rhythm: Normal rate and regular rhythm.  ?   Heart sounds: Normal heart sounds. No murmur heard. ?Pulmonary:  ?   Effort: Pulmonary effort is normal.  ?   Breath sounds: Normal breath sounds.  ?Abdominal:  ?   General: There is no distension.  ?   Palpations: Abdomen is soft. There is no mass.  ?   Tenderness: There is no abdominal tenderness.  ?Musculoskeletal:     ?   General: No swelling.  ?   Cervical back: Neck supple.  ?Lymphadenopathy:  ?   Cervical: No cervical  adenopathy.  ?Skin: ?   General: Skin is warm and dry.  ?Neurological:  ?   Mental Status: She is alert.  ?  ? ?Assessment/Plan ? ?History of tubulovillous adenoma. ?Surveillance colonoscopy. ? ?Hildred Laser, MD ?01/17/2022, 10:05 AM ? ? ? ?

## 2022-01-17 NOTE — Anesthesia Procedure Notes (Signed)
Date/Time: 01/17/2022 10:08 AM ?Performed by: Vista Deck, CRNA ?Pre-anesthesia Checklist: Patient identified, Emergency Drugs available, Suction available, Timeout performed and Patient being monitored ?Patient Re-evaluated:Patient Re-evaluated prior to induction ?Oxygen Delivery Method: Nasal Cannula ? ? ? ? ?

## 2022-01-18 LAB — SURGICAL PATHOLOGY

## 2022-01-22 ENCOUNTER — Encounter (HOSPITAL_COMMUNITY): Payer: Self-pay | Admitting: Internal Medicine

## 2022-04-03 DIAGNOSIS — M8589 Other specified disorders of bone density and structure, multiple sites: Secondary | ICD-10-CM | POA: Diagnosis not present

## 2022-05-11 DIAGNOSIS — E782 Mixed hyperlipidemia: Secondary | ICD-10-CM | POA: Diagnosis not present

## 2022-05-11 DIAGNOSIS — Z1331 Encounter for screening for depression: Secondary | ICD-10-CM | POA: Diagnosis not present

## 2022-05-11 DIAGNOSIS — Z6829 Body mass index (BMI) 29.0-29.9, adult: Secondary | ICD-10-CM | POA: Diagnosis not present

## 2022-05-11 DIAGNOSIS — R7301 Impaired fasting glucose: Secondary | ICD-10-CM | POA: Diagnosis not present

## 2022-05-11 DIAGNOSIS — I10 Essential (primary) hypertension: Secondary | ICD-10-CM | POA: Diagnosis not present

## 2022-05-11 DIAGNOSIS — E039 Hypothyroidism, unspecified: Secondary | ICD-10-CM | POA: Diagnosis not present

## 2022-05-11 DIAGNOSIS — M8580 Other specified disorders of bone density and structure, unspecified site: Secondary | ICD-10-CM | POA: Diagnosis not present

## 2022-05-11 DIAGNOSIS — Z0001 Encounter for general adult medical examination with abnormal findings: Secondary | ICD-10-CM | POA: Diagnosis not present

## 2022-05-11 DIAGNOSIS — E6609 Other obesity due to excess calories: Secondary | ICD-10-CM | POA: Diagnosis not present

## 2022-06-28 DIAGNOSIS — Z1231 Encounter for screening mammogram for malignant neoplasm of breast: Secondary | ICD-10-CM | POA: Diagnosis not present

## 2023-02-13 DIAGNOSIS — G4733 Obstructive sleep apnea (adult) (pediatric): Secondary | ICD-10-CM | POA: Diagnosis not present

## 2023-02-19 DIAGNOSIS — G4733 Obstructive sleep apnea (adult) (pediatric): Secondary | ICD-10-CM | POA: Diagnosis not present

## 2023-05-15 DIAGNOSIS — G4733 Obstructive sleep apnea (adult) (pediatric): Secondary | ICD-10-CM | POA: Diagnosis not present

## 2023-05-16 DIAGNOSIS — I10 Essential (primary) hypertension: Secondary | ICD-10-CM | POA: Diagnosis not present

## 2023-05-16 DIAGNOSIS — E782 Mixed hyperlipidemia: Secondary | ICD-10-CM | POA: Diagnosis not present

## 2023-05-16 DIAGNOSIS — E039 Hypothyroidism, unspecified: Secondary | ICD-10-CM | POA: Diagnosis not present

## 2023-05-16 DIAGNOSIS — Z6834 Body mass index (BMI) 34.0-34.9, adult: Secondary | ICD-10-CM | POA: Diagnosis not present

## 2023-05-16 DIAGNOSIS — E6609 Other obesity due to excess calories: Secondary | ICD-10-CM | POA: Diagnosis not present

## 2023-05-16 DIAGNOSIS — Z0001 Encounter for general adult medical examination with abnormal findings: Secondary | ICD-10-CM | POA: Diagnosis not present

## 2023-05-16 DIAGNOSIS — Z1331 Encounter for screening for depression: Secondary | ICD-10-CM | POA: Diagnosis not present

## 2023-05-16 DIAGNOSIS — R002 Palpitations: Secondary | ICD-10-CM | POA: Diagnosis not present

## 2023-05-16 DIAGNOSIS — E7849 Other hyperlipidemia: Secondary | ICD-10-CM | POA: Diagnosis not present

## 2023-05-16 DIAGNOSIS — R001 Bradycardia, unspecified: Secondary | ICD-10-CM | POA: Diagnosis not present

## 2023-07-12 DIAGNOSIS — E039 Hypothyroidism, unspecified: Secondary | ICD-10-CM | POA: Diagnosis not present

## 2023-07-12 DIAGNOSIS — E782 Mixed hyperlipidemia: Secondary | ICD-10-CM | POA: Diagnosis not present

## 2023-07-12 DIAGNOSIS — R7301 Impaired fasting glucose: Secondary | ICD-10-CM | POA: Diagnosis not present

## 2023-07-12 DIAGNOSIS — Z6834 Body mass index (BMI) 34.0-34.9, adult: Secondary | ICD-10-CM | POA: Diagnosis not present

## 2023-07-12 DIAGNOSIS — E6609 Other obesity due to excess calories: Secondary | ICD-10-CM | POA: Diagnosis not present

## 2023-07-12 DIAGNOSIS — I10 Essential (primary) hypertension: Secondary | ICD-10-CM | POA: Diagnosis not present

## 2023-07-12 DIAGNOSIS — E7849 Other hyperlipidemia: Secondary | ICD-10-CM | POA: Diagnosis not present

## 2023-08-15 DIAGNOSIS — G4733 Obstructive sleep apnea (adult) (pediatric): Secondary | ICD-10-CM | POA: Diagnosis not present

## 2023-09-19 DIAGNOSIS — Z1231 Encounter for screening mammogram for malignant neoplasm of breast: Secondary | ICD-10-CM | POA: Diagnosis not present

## 2023-09-25 DIAGNOSIS — R92323 Mammographic fibroglandular density, bilateral breasts: Secondary | ICD-10-CM | POA: Diagnosis not present

## 2023-09-25 DIAGNOSIS — N6310 Unspecified lump in the right breast, unspecified quadrant: Secondary | ICD-10-CM | POA: Diagnosis not present

## 2023-09-25 DIAGNOSIS — R928 Other abnormal and inconclusive findings on diagnostic imaging of breast: Secondary | ICD-10-CM | POA: Diagnosis not present

## 2023-09-25 DIAGNOSIS — N6011 Diffuse cystic mastopathy of right breast: Secondary | ICD-10-CM | POA: Diagnosis not present

## 2023-09-25 DIAGNOSIS — N6459 Other signs and symptoms in breast: Secondary | ICD-10-CM | POA: Diagnosis not present

## 2023-11-14 DIAGNOSIS — G4733 Obstructive sleep apnea (adult) (pediatric): Secondary | ICD-10-CM | POA: Diagnosis not present

## 2024-05-29 DIAGNOSIS — E039 Hypothyroidism, unspecified: Secondary | ICD-10-CM | POA: Diagnosis not present

## 2024-05-29 DIAGNOSIS — E782 Mixed hyperlipidemia: Secondary | ICD-10-CM | POA: Diagnosis not present

## 2024-05-29 DIAGNOSIS — E7849 Other hyperlipidemia: Secondary | ICD-10-CM | POA: Diagnosis not present

## 2024-05-29 DIAGNOSIS — Z6834 Body mass index (BMI) 34.0-34.9, adult: Secondary | ICD-10-CM | POA: Diagnosis not present

## 2024-05-29 DIAGNOSIS — Z1331 Encounter for screening for depression: Secondary | ICD-10-CM | POA: Diagnosis not present

## 2024-05-29 DIAGNOSIS — E6609 Other obesity due to excess calories: Secondary | ICD-10-CM | POA: Diagnosis not present

## 2024-05-29 DIAGNOSIS — Z0001 Encounter for general adult medical examination with abnormal findings: Secondary | ICD-10-CM | POA: Diagnosis not present

## 2024-05-29 DIAGNOSIS — I10 Essential (primary) hypertension: Secondary | ICD-10-CM | POA: Diagnosis not present

## 2024-07-02 DIAGNOSIS — E039 Hypothyroidism, unspecified: Secondary | ICD-10-CM | POA: Diagnosis not present
